# Patient Record
Sex: Male | Born: 1965 | Race: White | Hispanic: No | Marital: Married | Smoking: Never smoker
Health system: Southern US, Community
[De-identification: ages and names within clinical notes are randomized; demographics above are authoritative.]

## PROBLEM LIST (undated history)

## (undated) DIAGNOSIS — E109 Type 1 diabetes mellitus without complications: Secondary | ICD-10-CM

## (undated) HISTORY — PX: NASAL SINUS SURGERY: SHX719

---

## 2007-01-28 ENCOUNTER — Ambulatory Visit: Payer: Self-pay | Admitting: Unknown Physician Specialty

## 2007-02-20 ENCOUNTER — Ambulatory Visit: Payer: Self-pay | Admitting: Unknown Physician Specialty

## 2019-11-30 ENCOUNTER — Other Ambulatory Visit: Payer: Self-pay

## 2019-11-30 ENCOUNTER — Other Ambulatory Visit: Payer: Self-pay | Admitting: Internal Medicine

## 2019-11-30 ENCOUNTER — Ambulatory Visit
Admission: RE | Admit: 2019-11-30 | Discharge: 2019-11-30 | Disposition: A | Discharge status: Home / Self Care | Payer: BLUE CROSS/BLUE SHIELD | Source: Ambulatory Visit | Attending: Internal Medicine | Admitting: Internal Medicine

## 2019-11-30 DIAGNOSIS — R079 Chest pain, unspecified: Secondary | ICD-10-CM

## 2019-12-01 ENCOUNTER — Encounter
Admission: EM | Disposition: A | Discharge status: Home / Self Care | Payer: Self-pay | Source: Home / Self Care | Attending: Internal Medicine

## 2019-12-01 ENCOUNTER — Inpatient Hospital Stay: Payer: BLUE CROSS/BLUE SHIELD

## 2019-12-01 ENCOUNTER — Other Ambulatory Visit: Payer: Self-pay

## 2019-12-01 ENCOUNTER — Inpatient Hospital Stay
Admission: EM | Admit: 2019-12-01 | Discharge: 2019-12-02 | DRG: 247 | Disposition: A | Discharge status: Home / Self Care | Payer: BLUE CROSS/BLUE SHIELD | Attending: Internal Medicine | Admitting: Internal Medicine

## 2019-12-01 DIAGNOSIS — E871 Hypo-osmolality and hyponatremia: Secondary | ICD-10-CM | POA: Diagnosis present

## 2019-12-01 DIAGNOSIS — Z20822 Contact with and (suspected) exposure to covid-19: Secondary | ICD-10-CM | POA: Diagnosis present

## 2019-12-01 DIAGNOSIS — E782 Mixed hyperlipidemia: Secondary | ICD-10-CM | POA: Diagnosis present

## 2019-12-01 DIAGNOSIS — I2510 Atherosclerotic heart disease of native coronary artery without angina pectoris: Secondary | ICD-10-CM | POA: Diagnosis present

## 2019-12-01 DIAGNOSIS — I4581 Long QT syndrome: Secondary | ICD-10-CM | POA: Diagnosis present

## 2019-12-01 DIAGNOSIS — I10 Essential (primary) hypertension: Secondary | ICD-10-CM | POA: Diagnosis present

## 2019-12-01 DIAGNOSIS — I214 Non-ST elevation (NSTEMI) myocardial infarction: Principal | ICD-10-CM | POA: Diagnosis present

## 2019-12-01 DIAGNOSIS — E109 Type 1 diabetes mellitus without complications: Secondary | ICD-10-CM

## 2019-12-01 DIAGNOSIS — I4510 Unspecified right bundle-branch block: Secondary | ICD-10-CM | POA: Diagnosis present

## 2019-12-01 DIAGNOSIS — R0789 Other chest pain: Secondary | ICD-10-CM

## 2019-12-01 DIAGNOSIS — E876 Hypokalemia: Secondary | ICD-10-CM | POA: Diagnosis present

## 2019-12-01 DIAGNOSIS — Z794 Long term (current) use of insulin: Secondary | ICD-10-CM | POA: Diagnosis not present

## 2019-12-01 DIAGNOSIS — Z880 Allergy status to penicillin: Secondary | ICD-10-CM | POA: Diagnosis not present

## 2019-12-01 DIAGNOSIS — Z9641 Presence of insulin pump (external) (internal): Secondary | ICD-10-CM | POA: Diagnosis present

## 2019-12-01 HISTORY — DX: Type 1 diabetes mellitus without complications: E10.9

## 2019-12-01 HISTORY — PX: CORONARY STENT INTERVENTION: CATH118234

## 2019-12-01 HISTORY — PX: LEFT HEART CATH AND CORONARY ANGIOGRAPHY: CATH118249

## 2019-12-01 LAB — BASIC METABOLIC PANEL
Anion gap: 9 (ref 5–15)
BUN: 10 mg/dL (ref 6–20)
CO2: 24 mmol/L (ref 22–32)
Calcium: 8.5 mg/dL — ABNORMAL LOW (ref 8.9–10.3)
Chloride: 101 mmol/L (ref 98–111)
Creatinine, Ser: 0.91 mg/dL (ref 0.61–1.24)
GFR calc Af Amer: >60 mL/min (ref >60)
GFR calc non Af Amer: >60 mL/min (ref >60)
Glucose, Bld: 234 mg/dL — ABNORMAL HIGH (ref 70–99)
Potassium: 4.3 mmol/L (ref 3.5–5.1)
Sodium: 134 mmol/L — ABNORMAL LOW (ref 135–145)

## 2019-12-01 LAB — GLUCOSE, CAPILLARY
Glucose-Capillary: 112 mg/dL — ABNORMAL HIGH (ref 70–99)
Glucose-Capillary: 172 mg/dL — ABNORMAL HIGH (ref 70–99)
Glucose-Capillary: 176 mg/dL — ABNORMAL HIGH (ref 70–99)
Glucose-Capillary: 188 mg/dL — ABNORMAL HIGH (ref 70–99)
Glucose-Capillary: 221 mg/dL — ABNORMAL HIGH (ref 70–99)
Glucose-Capillary: 230 mg/dL — ABNORMAL HIGH (ref 70–99)
Glucose-Capillary: 234 mg/dL — ABNORMAL HIGH (ref 70–99)
Glucose-Capillary: 241 mg/dL — ABNORMAL HIGH (ref 70–99)

## 2019-12-01 LAB — TROPONIN I (HIGH SENSITIVITY)
Troponin I (High Sensitivity): 15 ng/L (ref <18)
Troponin I (High Sensitivity): 269 ng/L (ref <18)

## 2019-12-01 LAB — CBC
HCT: 44.3 % (ref 39.0–52.0)
HCT: 46.2 % (ref 39.0–52.0)
Hemoglobin: 15.1 g/dL (ref 13.0–17.0)
Hemoglobin: 15.6 g/dL (ref 13.0–17.0)
MCH: 30.6 pg (ref 26.0–34.0)
MCH: 30.3 pg (ref 26.0–34.0)
MCHC: 34.1 g/dL (ref 30.0–36.0)
MCHC: 33.8 g/dL (ref 30.0–36.0)
MCV: 89.7 fL (ref 80.0–100.0)
MCV: 89.7 fL (ref 80.0–100.0)
Platelets: 321 10*3/uL (ref 150–400)
Platelets: 339 10*3/uL (ref 150–400)
RBC: 4.94 MIL/uL (ref 4.22–5.81)
RBC: 5.15 MIL/uL (ref 4.22–5.81)
RDW: 11.9 % (ref 11.5–15.5)
RDW: 12 % (ref 11.5–15.5)
WBC: 8.4 10*3/uL (ref 4.0–10.5)
WBC: 10.1 10*3/uL (ref 4.0–10.5)
nRBC: 0 % (ref 0.0–0.2)
nRBC: 0 % (ref 0.0–0.2)

## 2019-12-01 LAB — CBC WITH DIFFERENTIAL/PLATELET
Abs Immature Granulocytes: 0.02 10*3/uL (ref 0.00–0.07)
Basophils Absolute: 0.1 10*3/uL (ref 0.0–0.1)
Basophils Relative: 1 %
Eosinophils Absolute: 0.1 10*3/uL (ref 0.0–0.5)
Eosinophils Relative: 1 %
HCT: 43.6 % (ref 39.0–52.0)
Hemoglobin: 14.8 g/dL (ref 13.0–17.0)
Immature Granulocytes: 0 %
Lymphocytes Relative: 32 %
Lymphs Abs: 2 10*3/uL (ref 0.7–4.0)
MCH: 30.5 pg (ref 26.0–34.0)
MCHC: 33.9 g/dL (ref 30.0–36.0)
MCV: 89.9 fL (ref 80.0–100.0)
Monocytes Absolute: 0.7 10*3/uL (ref 0.1–1.0)
Monocytes Relative: 11 %
Neutro Abs: 3.4 10*3/uL (ref 1.7–7.7)
Neutrophils Relative %: 55 %
Platelets: 310 10*3/uL (ref 150–400)
RBC: 4.85 MIL/uL (ref 4.22–5.81)
RDW: 12.1 % (ref 11.5–15.5)
WBC: 6.2 10*3/uL (ref 4.0–10.5)
nRBC: 0 % (ref 0.0–0.2)

## 2019-12-01 LAB — HEPARIN LEVEL (UNFRACTIONATED): Heparin Unfractionated: 0.62 IU/mL (ref 0.30–0.70)

## 2019-12-01 LAB — MAGNESIUM: Magnesium: 2.1 mg/dL (ref 1.7–2.4)

## 2019-12-01 LAB — COMPREHENSIVE METABOLIC PANEL
ALT: 24 U/L (ref 0–44)
AST: 17 U/L (ref 15–41)
Albumin: 4.2 g/dL (ref 3.5–5.0)
Alkaline Phosphatase: 70 U/L (ref 38–126)
Anion gap: 13 (ref 5–15)
BUN: 14 mg/dL (ref 6–20)
CO2: 23 mmol/L (ref 22–32)
Calcium: 9 mg/dL (ref 8.9–10.3)
Chloride: 100 mmol/L (ref 98–111)
Creatinine, Ser: 1 mg/dL (ref 0.61–1.24)
GFR calc Af Amer: >60 mL/min (ref >60)
GFR calc non Af Amer: >60 mL/min (ref >60)
Glucose, Bld: 262 mg/dL — ABNORMAL HIGH (ref 70–99)
Potassium: 3.2 mmol/L — ABNORMAL LOW (ref 3.5–5.1)
Sodium: 136 mmol/L (ref 135–145)
Total Bilirubin: 1 mg/dL (ref 0.3–1.2)
Total Protein: 6.9 g/dL (ref 6.5–8.1)

## 2019-12-01 LAB — LIPASE, BLOOD: Lipase: 30 U/L (ref 11–51)

## 2019-12-01 LAB — HEMOGLOBIN A1C
Hgb A1c MFr Bld: 6.7 % — ABNORMAL HIGH (ref 4.8–5.6)
Mean Plasma Glucose: 145.59 mg/dL

## 2019-12-01 LAB — LIPID PANEL
Cholesterol: 193 mg/dL (ref 0–200)
HDL: 49 mg/dL (ref >40)
LDL Cholesterol: 140 mg/dL — ABNORMAL HIGH (ref 0–99)
Total CHOL/HDL Ratio: 3.9 RATIO
Triglycerides: 22 mg/dL (ref <150)
VLDL: 4 mg/dL (ref 0–40)

## 2019-12-01 LAB — SARS CORONAVIRUS 2 (TAT 6-24 HRS): SARS Coronavirus 2: NEGATIVE

## 2019-12-01 LAB — HIV ANTIBODY (ROUTINE TESTING W REFLEX): HIV Screen 4th Generation wRfx: NONREACTIVE

## 2019-12-01 LAB — PROTIME-INR
INR: 0.9 (ref 0.8–1.2)
Prothrombin Time: 12.3 seconds (ref 11.4–15.2)

## 2019-12-01 LAB — APTT: aPTT: 26 seconds (ref 24–36)

## 2019-12-01 SURGERY — LEFT HEART CATH AND CORONARY ANGIOGRAPHY
Anesthesia: Moderate Sedation

## 2019-12-01 MED ORDER — SODIUM CHLORIDE 0.9% FLUSH: 3.0000 mL | INTRAVENOUS | Status: DC | PRN

## 2019-12-01 MED ORDER — ASPIRIN 81 MG PO CHEW: 81.0000 mg | CHEWABLE_TABLET | ORAL | Status: DC

## 2019-12-01 MED ORDER — NITROGLYCERIN 0.4 MG SL SUBL: 0.4000 mg | SUBLINGUAL_TABLET | SUBLINGUAL | Status: DC | PRN

## 2019-12-01 MED ORDER — BIVALIRUDIN TRIFLUOROACETATE 250 MG IV SOLR
INTRAVENOUS | Status: AC
  Filled 2019-12-01: qty 250

## 2019-12-01 MED ORDER — PRASUGREL HCL 10 MG PO TABS
ORAL_TABLET | ORAL | Status: DC | PRN
  Administered 2019-12-01: 60 mg via ORAL

## 2019-12-01 MED ORDER — PRASUGREL HCL 10 MG PO TABS
10.0000 mg | ORAL_TABLET | Freq: Every day | ORAL | Status: DC
  Administered 2019-12-02: 10 mg via ORAL
  Filled 2019-12-01: qty 1

## 2019-12-01 MED ORDER — METOPROLOL TARTRATE 25 MG PO TABS
12.5000 mg | ORAL_TABLET | Freq: Two times a day (BID) | ORAL | Status: DC
  Administered 2019-12-01 – 2019-12-02 (×3): 12.5 mg via ORAL
  Filled 2019-12-01 (×3): qty 1

## 2019-12-01 MED ORDER — MIDAZOLAM HCL 2 MG/2ML IJ SOLN
INTRAMUSCULAR | Status: DC | PRN
  Administered 2019-12-01 (×3): 1 mg via INTRAVENOUS

## 2019-12-01 MED ORDER — IOHEXOL 300 MG/ML  SOLN
INTRAMUSCULAR | Status: DC | PRN
  Administered 2019-12-01: 15:00:00 75 mL via INTRA_ARTERIAL

## 2019-12-01 MED ORDER — POTASSIUM CHLORIDE 20 MEQ PO PACK
40.0000 meq | PACK | Freq: Once | ORAL | Status: AC
  Administered 2019-12-01: 40 meq via ORAL
  Filled 2019-12-01: qty 2

## 2019-12-01 MED ORDER — INSULIN PUMP
SUBCUTANEOUS | Status: DC
  Administered 2019-12-01: 1.3 via SUBCUTANEOUS
  Filled 2019-12-01: qty 1

## 2019-12-01 MED ORDER — POTASSIUM CHLORIDE 20 MEQ PO PACK: 40.0000 meq | PACK | Freq: Once | ORAL | Status: DC

## 2019-12-01 MED ORDER — ASPIRIN 81 MG PO CHEW: 324.0000 mg | CHEWABLE_TABLET | Freq: Once | ORAL | Status: DC

## 2019-12-01 MED ORDER — FENTANYL CITRATE (PF) 100 MCG/2ML IJ SOLN
INTRAMUSCULAR | Status: AC
  Filled 2019-12-01: qty 2

## 2019-12-01 MED ORDER — MIDAZOLAM HCL 2 MG/2ML IJ SOLN
INTRAMUSCULAR | Status: AC
  Filled 2019-12-01: qty 2

## 2019-12-01 MED ORDER — NITROGLYCERIN 1 MG/10 ML FOR IR/CATH LAB
INTRA_ARTERIAL | Status: DC | PRN
  Administered 2019-12-01: 200 ug via INTRACORONARY

## 2019-12-01 MED ORDER — ACETAMINOPHEN 325 MG PO TABS: 650.0000 mg | ORAL_TABLET | ORAL | Status: DC | PRN

## 2019-12-01 MED ORDER — HEPARIN (PORCINE) IN NACL 1000-0.9 UT/500ML-% IV SOLN
INTRAVENOUS | Status: DC | PRN
  Administered 2019-12-01: 500 mL

## 2019-12-01 MED ORDER — METOPROLOL TARTRATE 25 MG PO TABS: 12.5000 mg | ORAL_TABLET | Freq: Two times a day (BID) | ORAL | Status: DC

## 2019-12-01 MED ORDER — SODIUM CHLORIDE 0.9 % IV SOLN
INTRAVENOUS | Status: AC | PRN
  Administered 2019-12-01: 500 mL via INTRAVENOUS

## 2019-12-01 MED ORDER — ASPIRIN EC 81 MG PO TBEC
81.0000 mg | DELAYED_RELEASE_TABLET | Freq: Every day | ORAL | Status: DC
  Administered 2019-12-02: 81 mg via ORAL
  Filled 2019-12-01: qty 1

## 2019-12-01 MED ORDER — FENTANYL CITRATE (PF) 100 MCG/2ML IJ SOLN
INTRAMUSCULAR | Status: DC | PRN
  Administered 2019-12-01: 50 ug via INTRAVENOUS
  Administered 2019-12-01: 25 ug via INTRAVENOUS

## 2019-12-01 MED ORDER — ATORVASTATIN CALCIUM 80 MG PO TABS
80.0000 mg | ORAL_TABLET | Freq: Every day | ORAL | Status: DC
  Administered 2019-12-01 (×2): 80 mg via ORAL
  Filled 2019-12-01: qty 4
  Filled 2019-12-01: qty 1

## 2019-12-01 MED ORDER — MIDAZOLAM HCL 2 MG/2ML IJ SOLN
INTRAMUSCULAR | Status: DC | PRN
  Administered 2019-12-01: 1 mg via INTRAVENOUS

## 2019-12-01 MED ORDER — ASPIRIN 300 MG RE SUPP: 300.0000 mg | RECTAL | Status: DC

## 2019-12-01 MED ORDER — IOHEXOL 350 MG/ML SOLN
75.0000 mL | Freq: Once | INTRAVENOUS | Status: AC | PRN
  Administered 2019-12-01: 75 mL via INTRAVENOUS

## 2019-12-01 MED ORDER — VITAMIN D 25 MCG (1000 UNIT) PO TABS
1000.0000 [IU] | ORAL_TABLET | Freq: Every day | ORAL | Status: DC
  Administered 2019-12-02: 1000 [IU] via ORAL
  Filled 2019-12-01: qty 1

## 2019-12-01 MED ORDER — TICAGRELOR 90 MG PO TABS
ORAL_TABLET | ORAL | Status: AC
  Filled 2019-12-01: qty 2

## 2019-12-01 MED ORDER — INSULIN ASPART 100 UNIT/ML ~~LOC~~ SOLN: 0.0000 [IU] | Freq: Four times a day (QID) | SUBCUTANEOUS | Status: DC

## 2019-12-01 MED ORDER — HEPARIN (PORCINE) IN NACL 1000-0.9 UT/500ML-% IV SOLN
INTRAVENOUS | Status: AC
  Filled 2019-12-01: qty 1000

## 2019-12-01 MED ORDER — SODIUM CHLORIDE 0.9 % WEIGHT BASED INFUSION: 1.0000 mL/kg/h | INTRAVENOUS | Status: DC

## 2019-12-01 MED ORDER — FENTANYL CITRATE (PF) 100 MCG/2ML IJ SOLN
INTRAMUSCULAR | Status: DC | PRN
  Administered 2019-12-01: 25 ug via INTRAVENOUS

## 2019-12-01 MED ORDER — ASPIRIN 81 MG PO CHEW: 324.0000 mg | CHEWABLE_TABLET | ORAL | Status: AC

## 2019-12-01 MED ORDER — ONDANSETRON HCL 4 MG/2ML IJ SOLN: 4.0000 mg | Freq: Four times a day (QID) | INTRAMUSCULAR | Status: DC | PRN

## 2019-12-01 MED ORDER — SODIUM CHLORIDE 0.9 % IV SOLN: 250.0000 mL | INTRAVENOUS | Status: DC | PRN

## 2019-12-01 MED ORDER — SODIUM CHLORIDE 0.9% FLUSH
3.0000 mL | Freq: Two times a day (BID) | INTRAVENOUS | Status: DC
  Administered 2019-12-02: 3 mL via INTRAVENOUS

## 2019-12-01 MED ORDER — ADULT MULTIVITAMIN W/MINERALS CH
1.0000 | ORAL_TABLET | Freq: Every day | ORAL | Status: DC
  Filled 2019-12-01: qty 1

## 2019-12-01 MED ORDER — SODIUM CHLORIDE 0.9 % WEIGHT BASED INFUSION
1.0000 mL/kg/h | INTRAVENOUS | Status: AC
  Administered 2019-12-01: 1 mL/kg/h via INTRAVENOUS

## 2019-12-01 MED ORDER — SODIUM CHLORIDE 0.9 % IV SOLN
INTRAVENOUS | Status: DC | PRN
  Administered 2019-12-01: 14:00:00 1.75 mg/kg/h via INTRAVENOUS

## 2019-12-01 MED ORDER — IOHEXOL 300 MG/ML  SOLN
INTRAMUSCULAR | Status: DC | PRN
  Administered 2019-12-01: 85 mL

## 2019-12-01 MED ORDER — SODIUM CHLORIDE 0.9% FLUSH
3.0000 mL | Freq: Two times a day (BID) | INTRAVENOUS | Status: DC
  Administered 2019-12-01 – 2019-12-02 (×3): 3 mL via INTRAVENOUS

## 2019-12-01 MED ORDER — INSULIN ASPART 100 UNIT/ML ~~LOC~~ SOLN: 75.0000 [IU] | Freq: Every day | SUBCUTANEOUS | Status: DC

## 2019-12-01 MED ORDER — SODIUM CHLORIDE 0.9 % WEIGHT BASED INFUSION: 3.0000 mL/kg/h | INTRAVENOUS | Status: DC

## 2019-12-01 MED ORDER — NITROGLYCERIN 1 MG/10 ML FOR IR/CATH LAB
INTRA_ARTERIAL | Status: AC
  Filled 2019-12-01: qty 10

## 2019-12-01 MED ORDER — HEPARIN BOLUS VIA INFUSION
4000.0000 [IU] | Freq: Once | INTRAVENOUS | Status: AC
  Administered 2019-12-01: 4000 [IU] via INTRAVENOUS
  Filled 2019-12-01: qty 4000

## 2019-12-01 MED ORDER — NITROGLYCERIN 2 % TD OINT
0.5000 [in_us] | TOPICAL_OINTMENT | Freq: Four times a day (QID) | TRANSDERMAL | Status: DC
  Administered 2019-12-01 – 2019-12-02 (×3): 0.5 [in_us] via TOPICAL
  Filled 2019-12-01 (×4): qty 1

## 2019-12-01 MED ORDER — PRASUGREL HCL 10 MG PO TABS
ORAL_TABLET | ORAL | Status: AC
  Filled 2019-12-01: qty 6

## 2019-12-01 MED ORDER — BIVALIRUDIN BOLUS VIA INFUSION - CUPID
INTRAVENOUS | Status: DC | PRN
  Administered 2019-12-01: 54.6 mg via INTRAVENOUS

## 2019-12-01 MED ORDER — ZOLPIDEM TARTRATE 5 MG PO TABS: 5.0000 mg | ORAL_TABLET | Freq: Every evening | ORAL | Status: DC | PRN

## 2019-12-01 MED ORDER — HEPARIN (PORCINE) 25000 UT/250ML-% IV SOLN
1000.0000 [IU]/h | INTRAVENOUS | Status: DC
  Administered 2019-12-01: 1000 [IU]/h via INTRAVENOUS
  Filled 2019-12-01: qty 250

## 2019-12-01 SURGICAL SUPPLY — 20 items
BALLN EUPHORA RX 2.5X12 (BALLOONS) ×3
BALLN ~~LOC~~ TREK RX 3.75X12 (BALLOONS) ×3
BALLOON EUPHORA RX 2.5X12 (BALLOONS) ×1 IMPLANT
BALLOON ~~LOC~~ TREK RX 3.75X12 (BALLOONS) ×1 IMPLANT
CATH EAGLE EYE PLAT IMAGING (CATHETERS) ×3 IMPLANT
CATH INFINITI 5FR ANG PIGTAIL (CATHETERS) ×3 IMPLANT
CATH INFINITI 5FR JL4 (CATHETERS) ×3 IMPLANT
CATH INFINITI JR4 5F (CATHETERS) ×3 IMPLANT
CATH VISTA GUIDE 6FR XBLAD3.5 (CATHETERS) ×3 IMPLANT
CATH VISTA GUIDE 6FR XBLAD4 (CATHETERS) ×3 IMPLANT
DEVICE CLOSURE MYNXGRIP 6/7F (Vascular Products) ×3 IMPLANT
DEVICE INFLAT 30 PLUS (MISCELLANEOUS) ×3 IMPLANT
KIT MANI 3VAL PERCEP (MISCELLANEOUS) ×3 IMPLANT
NEEDLE PERC 18GX7CM (NEEDLE) ×3 IMPLANT
PACK CARDIAC CATH (CUSTOM PROCEDURE TRAY) ×3 IMPLANT
SHEATH AVANTI 5FR X 11CM (SHEATH) ×3 IMPLANT
SHEATH AVANTI 6FR X 11CM (SHEATH) ×3 IMPLANT
STENT RESOLUTE ONYX 3.5X34 (Permanent Stent) ×3 IMPLANT
WIRE GUIDERIGHT .035X150 (WIRE) ×3 IMPLANT
WIRE RUNTHROUGH .014X180CM (WIRE) ×3 IMPLANT

## 2019-12-01 NOTE — ED Notes (Signed)
Pt ambulates on own to restroom in room.

## 2019-12-01 NOTE — ED Triage Notes (Addendum)
Pt arrives to ED via EMS due to chest pain.  States chest pain has been ongoing for 3 weeks and has seen by PCP and cardiology. States tonight at approx 0030 he felt a pain in the center of the chest that radiated to back. PT reports lack of sleep the last day, possible anxiety at this  Time.  PT self administered 324mg  aspirin at home prior to EMS arrival

## 2019-12-01 NOTE — ED Notes (Signed)
Pt requesting that his blood sugar be checked, states that he had to remove his insulin pump and reader for his ct scan. Pt asks that we check his glucose every hour until he is able to replace his pump and monitor. Pt states that his wife will bring another needle, but wasn't planning to have her do that until he is admitted into a pt room Pt assisted to the bathroom without difficulty.

## 2019-12-01 NOTE — ED Notes (Signed)
Pt allowed to ambulate to toilet for voiding, pt returned to bed and provided water and applesauce. Pt states he is going to dose self with insulin at this time. States BG is 279.

## 2019-12-01 NOTE — ED Notes (Signed)
Hospitalist at bedside 

## 2019-12-01 NOTE — ED Notes (Signed)
Admitting dr to the desk to update this RN regarding pt's plan of care. Maintain pt at npo, pt given warm blanket upon request, lights turned down, pt states current blood sugar at 188. No distress or further needs at this time, oriented to the call bell as needed

## 2019-12-01 NOTE — ED Notes (Signed)
PT states at this time he is unsure if he will allow COVId swab or refuse. Patient is experiencing increased anxiety following education on condition by MD and nurse. PT provided time by self to collect thoughts and nurse will go back shortly.

## 2019-12-01 NOTE — Plan of Care (Signed)
Pt arrived from Cath lab. Right Fem site is WNL. No skin issues and verified with Laney Pastor. Pt oriented room and unit.

## 2019-12-01 NOTE — Progress Notes (Signed)
ANTICOAGULATION CONSULT NOTE - Initial Consult  Pharmacy Consult for heparin Indication: chest pain/ACS  Allergies  Allergen Reactions  . Penicillins     Patient Measurements: Height: 5\' 8"  (172.7 cm) Weight: 160 lb 6.4 oz (72.8 kg) IBW/kg (Calculated) : 68.4 Heparin Dosing Weight: 72.8 kg  Vital Signs: Temp: 97.8 F (36.6 C) (03/12 0134) Temp Source: Oral (03/12 0134) BP: 165/76 (03/12 0330) Pulse Rate: 70 (03/12 0330)  Labs: Recent Labs    12/01/19 0139 12/01/19 0333  HGB 14.8  --   HCT 43.6  --   PLT 310  --   CREATININE 1.00  --   TROPONINIHS 15 269*    Estimated Creatinine Clearance: 84.6 mL/min (by C-G formula based on SCr of 1 mg/dL).   Medical History: Past Medical History:  Diagnosis Date  . Type 1 diabetes (HCC)     Medications:  Scheduled:  . heparin  4,000 Units Intravenous Once    Assessment: Patient admitted for CP x 3 weeks that starts at center of chest and radiates to back w/ h/o atypical CP, DMI, and HLD doesn't appear to be on anticoagulation PTA. Initial trop was 15 then >> 269, EKG pending. Baseline aPTT/INR pending. Patient is being started heparin drip for management of NSTEMI.  Goal of Therapy:  Heparin level 0.3-0.7 units/ml Monitor platelets by anticoagulation protocol: Yes   Plan:  Will bolus heparin 4000 units IV x 1 Will start heparin rate at 1000 units/hr Will check anti-Xa at 1000. Will monitor daily CBC's and adjust per anti-Xa levels.  01/31/20, PharmD, BCPS Clinical Pharmacist 12/01/2019,4:13 AM

## 2019-12-01 NOTE — ED Notes (Signed)
Received call from cath lab RN that they had called lab at [facility] where 6-24hr swab was being run and were told it would be 1500 before result would be available. CL RN states they will just come and get pt now to prep for cath lab per cardiology.
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
## 2019-12-01 NOTE — ED Notes (Signed)
Troponin 269, Reported from lab. DR. Scotty Court notified.

## 2019-12-01 NOTE — ED Notes (Addendum)
Notified by MD Colon Branch that lab had been called and asked if it would be better to redo pt's Covid swab as a rapid test d/t need for cath lab today or wait on the previously collected sample to result. Per MD, lab stated that previously collected sample would result faster than a new rapid test would and it would be a waste of resources to run another one. 2nd swab held at this time per MD.

## 2019-12-01 NOTE — Progress Notes (Signed)
Inpatient Diabetes Program Recommendations  AACE/ADA: New Consensus Statement on Inpatient Glycemic Control (2015)  Target Ranges:  Prepandial:   less than 140 mg/dL      Peak postprandial:   less than 180 mg/dL (1-2 hours)      Critically ill patients:  140 - 180 mg/dL   Lab Results  Component Value Date   GLUCAP 234 (H) 12/01/2019   HGBA1C 6.7 (H) 12/01/2019    Review of Glycemic Control  Diabetes history: DM1 Outpatient Diabetes medications: T Slim insulin pump Current orders for Inpatient glycemic control: Insulin pump + Novolog 0-6 correction scale q 4 hrs.  Inpatient Diabetes Program Recommendations:    If patient is covering CBGs with insulin pump, will need to D/C Novolog correction scale. Patient will need to sign insulin pump contract, and keep flowsheet in his room to document amount of insulin given. CBGs will continue to be by hospital meter.  Per office visit note with endocrinologist Dr. Gershon Crane 10/16/19: "He is currently on the T slim pump (not basal IQ) as above: Basal rates Midnight = 0.575 4 AM = 0.675  7 AM = 0.7 11 AM = 0.6 1 PM = 0.7 6 PM = 0.9 TDD basal: 17.225 units  Bolus settings I/C: 8 at midnight, 6 at 6 PM ISF: 40 Target Glucose: 100 Active insulin time: 4 hours  Total Daily dose approximately 40 units."  Thank you, Darel Hong E. Johncarlos Holtsclaw, RN, MSN, CDE  Diabetes Coordinator Inpatient Glycemic Control Team Team Pager [phone] (8am-5pm) 12/01/2019 11:11 AM

## 2019-12-01 NOTE — ED Notes (Signed)
Pt states he took 324mg  baby aspirin while at home prior to EMS arrival. Md notified. Order DC

## 2019-12-01 NOTE — ED Notes (Signed)
Pt resting quietly at this time

## 2019-12-01 NOTE — ED Notes (Signed)
Pt being taken to ct via ct tech

## 2019-12-01 NOTE — Consult Note (Signed)
[facility] Cardiology Consultation Note  Patient ID: Jamie Roach, MRN: [id number], DOB/AGE: January 01, 1966 54 y.o. Admit date: 12/01/2019   Date of Consult: 12/01/2019 Primary Physician: Albina Billet, MD Primary Cardiologist: Ubaldo Glassing  Chief Complaint:  Chief Complaint  Patient presents with  . Chest Pain   Reason for Consult: Chest pain  HPI: 54 y.o. male with diabetes hyperlipidemia who has had significant episodes of chest discomfort over the last several weeks waxing and waning.  This is culminated to wrist pain for which the patient has had concerns.  EKG has shown normal sinus rhythm.  Normal EKG.  The patient additionally has had an elevated troponin of 269 consistent with possible non-ST elevation myocardial infarction.  The patient was placed on heparin and has had some other mild symptoms of chest pain throughout the back.  Currently he is having some discomfort to a minor degree.  Past Medical History:  Diagnosis Date  . Type 1 diabetes [facility])       Surgical History:  Past Surgical History:  Procedure Laterality Date  . NASAL SINUS SURGERY       Home Meds: Prior to Admission medications   Medication Sig Start Date End Date Taking? Authorizing Provider  Cholecalciferol 25 MCG (1000 UT) capsule Take by mouth.   Yes [provider]  Multiple Vitamin (MULTI-VITAMIN) tablet Take by mouth.   Yes [provider]  NOVOLOG 100 UNIT/ML injection SMARTSIG:75 Unit(s) SUB-Q Daily 09/18/19  Yes [provider]    Inpatient Medications:  . aspirin  324 mg Oral NOW   Or  . aspirin  300 mg Rectal NOW  . [START ON 12/02/2019] aspirin EC  81 mg Oral Daily  . atorvastatin  80 mg Oral q1800  . cholecalciferol  1,000 Units Oral Daily  . insulin aspart  0-9 Units Subcutaneous Q6H  . insulin pump   Subcutaneous Q4H  . metoprolol tartrate  12.5 mg Oral BID  . multivitamin with minerals  1 tablet Oral Daily  . nitroGLYCERIN  0.5 inch Topical Q6H  . potassium  chloride  40 mEq Oral Once   . heparin 1,000 Units/hr (12/01/19 0445)    Allergies:  Allergies  Allergen Reactions  . Penicillins     Social History   Socioeconomic History  . Marital status: Married    Spouse name: Not on file  . Number of children: Not on file  . Years of education: Not on file  . Highest education level: Not on file  Occupational History  . Not on file  Tobacco Use  . Smoking status: Never Smoker  . Smokeless tobacco: Never Used  Substance and Sexual Activity  . Alcohol use: Never  . Drug use: Never  . Sexual activity: Yes  Other Topics Concern  . Not on file  Social History Narrative  . Not on file   Social Determinants of Health   Financial Resource Strain:   . Difficulty of Paying Living Expenses:   Food Insecurity:   . Worried About Charity fundraiser in the Last Year:   . Arboriculturist in the Last Year:   Transportation Needs:   . Film/video editor (Medical):   Marland Kitchen Lack of Transportation (Non-Medical):   Physical Activity:   . Days of Exercise per Week:   . Minutes of Exercise per Session:   Stress:   . Feeling of Stress :   Social Connections:   . Frequency of Communication with Friends and Family:   .  Frequency of Social Gatherings with Friends and Family:   . Attends Religious Services:   . Active Member of Clubs or Organizations:   . Attends Banker Meetings:   Marland Kitchen Marital Status:   Intimate Partner Violence:   . Fear of Current or Ex-Partner:   . Emotionally Abused:   Marland Kitchen Physically Abused:   . Sexually Abused:      History reviewed. No pertinent family history.   Review of Systems Positive for chest pain Negative for: General:  chills, fever, night sweats or weight changes.  Cardiovascular: PND orthopnea syncope dizziness  Dermatological skin lesions rashes Respiratory: Cough congestion Urologic: Frequent urination urination at night and hematuria Abdominal: negative for nausea, vomiting, diarrhea,  bright red blood per rectum, melena, or hematemesis Neurologic: negative for visual changes, and/or hearing changes  All other systems reviewed and are otherwise negative except as noted above.  Labs: No results for input(s): CKTOTAL, CKMB, TROPONINI in the last 72 hours. Lab Results  Component Value Date   WBC 8.4 12/01/2019   HGB 15.1 12/01/2019   HCT 44.3 12/01/2019   MCV 89.7 12/01/2019   PLT 321 12/01/2019    Recent Labs  Lab 12/01/19 0139  NA 136  K 3.2*  CL 100  CO2 23  BUN 14  CREATININE 1.00  CALCIUM 9.0  PROT 6.9  BILITOT 1.0  ALKPHOS 70  ALT 24  AST 17  GLUCOSE 262*   Lab Results  Component Value Date   CHOL 193 12/01/2019   HDL 49 12/01/2019   LDLCALC 140 (H) 12/01/2019   TRIG 22 12/01/2019   No results found for: DDIMER  Radiology/Studies:  DG Chest 2 View  Result Date: 11/30/2019 CLINICAL DATA:  Chest pain EXAM: CHEST - 2 VIEW COMPARISON:  None. FINDINGS: The heart size and mediastinal contours are within normal limits. Both lungs are clear. The visualized skeletal structures are unremarkable. IMPRESSION: No acute abnormality of the lungs. Electronically Signed   By: Lauralyn Primes M.D.   On: 11/30/2019 16:11    EKG: Normal sinus rhythm otherwise normal EKG  Weights: Filed Weights   12/01/19 0136  Weight: 72.8 kg  Rest of physical exam as per prime doc    Assessment: 54 year old male with hyperlipidemia diabetes with a possible acute non-ST elevation myocardial infarction  Plan: 1.  Heparin for further risk reduction of myocardial infarction in addition would use aspirin 2.  Beta-blocker if able for better heart rate control and risk reduction of anginal symptoms 3.  Nitrates for chest pain 4.  Proceed to cardiac catheterization to assess coronary anatomy and further treatment thereof as necessary.  Patient understands risk and benefits of cardiac catheterization.  This includes a possibility of death stroke heart attack infection bleeding or  blood clot.  He is at low risk for conscious sedation  Signed, Lamar Blinks M.D. [facility] Cardiology 12/01/2019, 8:16 AM

## 2019-12-01 NOTE — Progress Notes (Signed)
ANTICOAGULATION CONSULT NOTE - Initial Consult  Pharmacy Consult for heparin Indication: chest pain/ACS  Allergies  Allergen Reactions  . Penicillins     Patient Measurements: Height: 5\' 8"  (172.7 cm) Weight: 160 lb 6.4 oz (72.8 kg) IBW/kg (Calculated) : 68.4 Heparin Dosing Weight: 72.8 kg  Vital Signs: Temp: 97.8 F (36.6 C) (03/12 0134) Temp Source: Oral (03/12 0134) BP: 138/68 (03/12 1230) Pulse Rate: 108 (03/12 1230)  Labs: Recent Labs    12/01/19 0139 12/01/19 0139 12/01/19 0333 12/01/19 0442 12/01/19 1018  HGB 14.8   < >  --  15.1 15.6  HCT 43.6  --   --  44.3 46.2  PLT 310  --   --  321 339  APTT 26  --   --   --   --   LABPROT 12.3  --   --   --   --   INR 0.9  --   --   --   --   HEPARINUNFRC  --   --   --   --  0.62  CREATININE 1.00  --   --   --  0.91  TROPONINIHS 15  --  269*  --   --    < > = values in this interval not displayed.    Estimated Creatinine Clearance: 92.9 mL/min (by C-G formula based on SCr of 0.91 mg/dL).   Medical History: Past Medical History:  Diagnosis Date  . Type 1 diabetes (HCC)     Medications:  Scheduled:  . aspirin  324 mg Oral NOW   Or  . aspirin  300 mg Rectal NOW  . [START ON 12/02/2019] aspirin  81 mg Oral Pre-Cath  . [START ON 12/02/2019] aspirin EC  81 mg Oral Daily  . atorvastatin  80 mg Oral q1800  . cholecalciferol  1,000 Units Oral Daily  . insulin aspart  0-9 Units Subcutaneous Q6H  . insulin pump   Subcutaneous Q4H  . metoprolol tartrate  12.5 mg Oral BID  . multivitamin with minerals  1 tablet Oral Daily  . nitroGLYCERIN  0.5 inch Topical Q6H  . potassium chloride  40 mEq Oral Once  . sodium chloride flush  3 mL Intravenous Q12H    Assessment: Patient admitted for CP x 3 weeks that starts at center of chest and radiates to back w/ h/o atypical CP, DMI, and HLD doesn't appear to be on anticoagulation PTA. Initial trop was 15 then >> 269, EKG pending. Baseline aPTT/INR pending. Patient is being  started heparin drip for management of NSTEMI.  3/12 Heparin drip initiated at 1000 units/hr 3/12 10:18 HL 0.62, therapeutic x 1  Goal of Therapy:  Heparin level 0.3-0.7 units/ml Monitor platelets by anticoagulation protocol: Yes   Plan:  3/12 10:18 HL 0.62 is therapeutic. Will continue with current rate of 1000 units/hr. Recheck HL in 6 hours for confirmation. Daily CBC while on Heparin drip.  5/12, PharmD, BCPS 12/01/2019 12:50 PM

## 2019-12-01 NOTE — Progress Notes (Signed)
[facility] Cardiology [facility] Encounter Note  Patient: Jamie Roach / Admit Date: 12/01/2019 / Date of Encounter: 12/01/2019, 1:28 PM   Subjective: Patient had full resolution of chest discomfort and no new EKG changes.  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 rate 18, height 5\' 8"  (1.727 m), weight 72.8 kg, SpO2 98 %. Body mass index is 24.39 kg/m. General: Well developed, well nourished, in no acute distress. Head: Normocephalic, atraumatic, sclera non-icteric, no xanthomas, nares are without discharge. Neck: No apparent masses Lungs: Normal respirations with no wheezes, no rhonchi, no rales , no crackles   Heart: Regular rate and rhythm, normal S1 S2, no murmur, no rub, no gallop, PMI is normal size and placement, carotid upstroke normal without bruit, jugular venous pressure normal Abdomen: Soft, non-tender, non-distended with normoactive bowel sounds. No hepatosplenomegaly. Abdominal aorta is normal size without bruit Extremities: No edema, no clubbing, no cyanosis, no ulcers,  Peripheral: 2+ radial, 2+ femoral, 2+ dorsal pedal pulses Neuro: Alert and oriented. Moves all extremities spontaneously. Psych:  Responds to questions appropriately with a normal affect.  No intake or output data in the 24 hours ending 12/01/19 1328  Inpatient Medications:  . [MAR Hold]  aspirin  324 mg Oral NOW   Or  . [MAR Hold] aspirin  300 mg Rectal NOW  . [START ON 12/02/2019] aspirin  81 mg Oral Pre-Cath  . [MAR Hold] aspirin EC  81 mg Oral Daily  . [MAR Hold] atorvastatin  80 mg Oral q1800  . [MAR Hold] cholecalciferol  1,000 Units Oral Daily  . [MAR Hold] insulin aspart  0-9 Units Subcutaneous Q6H  . [MAR Hold] insulin pump   Subcutaneous Q4H  . [MAR Hold] metoprolol tartrate  12.5 mg Oral BID  . [MAR Hold] multivitamin with minerals  1 tablet Oral Daily  . [MAR Hold] nitroGLYCERIN  0.5 inch Topical Q6H  . [MAR Hold] potassium chloride  40 mEq Oral Once  . [MAR Hold] sodium chloride flush  3 mL Intravenous Q12H   Infusions:  . sodium chloride    . [START ON 12/02/2019] sodium chloride     Followed by  . [START ON 12/02/2019] sodium chloride    . heparin 1,000 Units/hr (12/01/19 1029)    Labs: Recent Labs    12/01/19 0139 12/01/19 0333 12/01/19 1018  NA 136  --  134*  K 3.2*  --  4.3  CL 100  --  101  CO2 23  --  24  GLUCOSE 262*  --  234*  BUN 14  --  10  CREATININE 1.00  --  0.91  CALCIUM 9.0  --  8.5*  MG  --  2.1  --    Recent Labs    12/01/19 0139  AST 17  ALT 24  ALKPHOS 70  BILITOT 1.0  PROT 6.9  ALBUMIN 4.2   Recent Labs    12/01/19 0139 12/01/19 0139 12/01/19 0442 12/01/19 1018  WBC 6.2   < > 8.4 10.1  NEUTROABS 3.4  --   --   --  HGB 14.8   < > 15.1 15.6  HCT 43.6   < > 44.3 46.2  MCV 89.9   < > 89.7 89.7  PLT 310   < > 321 339   < > = values in this interval not displayed.   No results for input(s): CKTOTAL, CKMB, TROPONINI in the last 72 hours. Invalid input(s): POCBNP Recent Labs    12/01/19 0333  HGBA1C 6.7*     Weights: Filed Weights   12/01/19 0136  Weight: 72.8 kg     Radiology/Studies:  DG Chest 2 View  Result Date: 11/30/2019 CLINICAL DATA:  Chest pain EXAM: CHEST - 2 VIEW COMPARISON:  None. FINDINGS: The heart size and mediastinal contours are within normal limits. Both lungs are clear. The  visualized skeletal structures are unremarkable. IMPRESSION: No acute abnormality of the lungs. Electronically Signed   By: Eddie Candle M.D.   On: 11/30/2019 16:11   CT ANGIO CHEST PE W OR WO CONTRAST  Result Date: 12/01/2019 CLINICAL DATA:  Central chest pain, radiating to back EXAM: CT ANGIOGRAPHY CHEST WITH CONTRAST TECHNIQUE: Multidetector CT imaging of the chest was performed using the standard protocol during bolus administration of intravenous contrast. Multiplanar CT image reconstructions and MIPs were obtained to evaluate the vascular anatomy. CONTRAST:  40mL OMNIPAQUE IOHEXOL 350 MG/ML SOLN COMPARISON:  Chest radiographs, 11/30/2019 FINDINGS: Cardiovascular: Preferential opacification of the thoracic aorta. Normal contour and caliber of the thoracic aorta. No evidence of aneurysm or dissection. No significant aortic atherosclerosis. No evidence of central pulmonary embolism on this non tailored examination normal heart size. Left coronary artery calcifications. No pericardial effusion. Mediastinum/Nodes: No enlarged mediastinal, hilar, or axillary lymph nodes. Thyroid gland, trachea, and esophagus demonstrate no significant findings. Lungs/Pleura: Dependent bibasilar partial atelectasis and/or scarring. No pleural effusion or pneumothorax. Upper Abdomen: No acute abnormality. Musculoskeletal: No chest wall abnormality. No acute or significant osseous findings. Review of the MIP images confirms the above findings. IMPRESSION: 1. No CT evidence of thoracic aortic dissection, aneurysm, or other acute aortic pathology. No significant aortic atherosclerosis. 2. Left coronary artery calcifications. Electronically Signed   By: Eddie Candle M.D.   On: 12/01/2019 08:46     Assessment and Recommendation  54 y.o. 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  PCI and stent  placement of proximal left anterior descending artery 2.  High intensity cholesterol therapy 3.  Dual antiplatelet therapy 4.  Beta-blocker ACE inhibitor if able depending on blood pressure 5.  Begin reading cardiac rehabilitation  Signed, Serafina Royals M.D. FACC

## 2019-12-01 NOTE — Progress Notes (Signed)
Brief hospitalist update note.  Nonbillable note.  Please see scanned H&P for full details.  54 year old male patient presents for evaluation of chest pain.  Troponin elevation consistent with non-ST elevation myocardial infarction.  Case discussed with Dr. Gwen Pounds.  Patient underwent cardiac catheterization today.  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  Patient underwent successful PCI and stent placement of proximal LAD.  Will initiate high intensity statin therapy, dual antiplatelet therapy, beta-blocker therapy, ACE inhibitor therapy if blood pressure tolerates.  Tentative plan to discharge home on 12/02/2019.  Lolita Patella MD

## 2019-12-01 NOTE — ED Provider Notes (Addendum)
[facility] Emergency Department Provider Note  ____________________________________________  Time seen: Approximately 2:20 AM  I have reviewed the triage vital signs and the nursing notes.   HISTORY  Chief Complaint Chest Pain    HPI Jamie Roach is a 54 y.o. male with a history of type 1 diabetes who comes the ED due to central chest pain described as aching, radiating to the back, not associated with shortness of breath diaphoresis or vomiting.  Onset while getting in bed, ready to go to sleep.  No aggravating or alleviating factors.  Lasted about 20 minutes, and when EMS arrived he reported that it is resolved.  Currently chest pain is 1/10.  He has been having the symptoms for the past several weeks, seen by primary care and cardiology Dr. Lady Gary with reassuring work-up so far.   Patient took 324 mg of aspirin at home at the direction of 911 call center prior to EMS arrival at his house.   Past Medical History:  Diagnosis Date  . Type 1 diabetes (HCC)      There are no problems to display for this patient.    Past Surgical History:  Procedure Laterality Date  . NASAL SINUS SURGERY       Prior to Admission medications   Not on File     Allergies Penicillins   History reviewed. No pertinent family history.  Social History Social History   Tobacco Use  . Smoking status: Never Smoker  . Smokeless tobacco: Never Used  Substance Use Topics  . Alcohol use: Never  . Drug use: Never    Review of Systems  Constitutional:   No fever or chills.  ENT:   No sore throat. No rhinorrhea. Cardiovascular: Positive chest pain as above without syncope. Respiratory:   No dyspnea or cough. Gastrointestinal:   Negative for abdominal pain, vomiting and diarrhea.  Musculoskeletal:   Negative for focal pain or swelling All other systems reviewed and are negative except as documented above in ROS and  HPI.  ____________________________________________   PHYSICAL EXAM:  VITAL SIGNS: ED Triage Vitals  Enc Vitals Group     BP 12/01/19 0134 (!) 172/88     Pulse Rate 12/01/19 0134 91     Resp 12/01/19 0134 18     Temp 12/01/19 0134 97.8 F (36.6 C)     Temp Source 12/01/19 0134 Oral     SpO2 12/01/19 0134 100 %     Weight 12/01/19 0136 160 lb 6.4 oz (72.8 kg)     Height 12/01/19 0136 5\' 8"  (1.727 m)     Head Circumference --      Peak Flow --      Pain Score 12/01/19 0135 1     Pain Loc --      Pain Edu? --      Excl. in GC? --     Vital signs reviewed, nursing assessments reviewed.   Constitutional:   Alert and oriented. Non-toxic appearance. Eyes:   Conjunctivae are normal. EOMI. PERRL. ENT      Head:   Normocephalic and atraumatic.      Nose:   Wearing a mask.      Mouth/Throat:   Wearing a mask.      Neck:   No meningismus. Full ROM. Hematological/Lymphatic/Immunilogical:   No cervical lymphadenopathy. Cardiovascular:   RRR. Symmetric bilateral radial and DP pulses.  No murmurs. Cap refill less than 2 seconds. Respiratory:   Normal respiratory effort without tachypnea/retractions. Breath sounds  are clear and equal bilaterally. No wheezes/rales/rhonchi. Gastrointestinal:   Soft and nontender. Non distended. There is no CVA tenderness.  No rebound, rigidity, or guarding.  Musculoskeletal:   Normal range of motion in all extremities. No joint effusions.  No lower extremity tenderness.  No edema.  Chest wall nontender Neurologic:   Normal speech and language.  Motor grossly intact. No acute focal neurologic deficits are appreciated.  Skin:    Skin is warm, dry and intact. No rash noted.  No petechiae, purpura, or bullae.  ____________________________________________    LABS (pertinent positives/negatives) (all labs ordered are listed, but only abnormal results are displayed) Labs Reviewed  COMPREHENSIVE METABOLIC PANEL - Abnormal; Notable for the following  components:      Result Value   Potassium 3.2 (*)    Glucose, Bld 262 (*)    All other components within normal limits  TROPONIN I (HIGH SENSITIVITY) - Abnormal; Notable for the following components:   Troponin I (High Sensitivity) 269 (*)    All other components within normal limits  SARS CORONAVIRUS 2 (TAT 6-24 HRS)  LIPASE, BLOOD  CBC WITH DIFFERENTIAL/PLATELET  PROTIME-INR  APTT  HEPARIN LEVEL (UNFRACTIONATED)  CBC  TROPONIN I (HIGH SENSITIVITY)   ____________________________________________   EKG  Interpreted by me Sinus rhythm rate of 98, normal axis.  Prolonged QTC of 530 ms.  Normal QRS ST segments and T waves.  ____________________________________________    RADIOLOGY  DG Chest 2 View  Result Date: 11/30/2019 CLINICAL DATA:  Chest pain EXAM: CHEST - 2 VIEW COMPARISON:  None. FINDINGS: The heart size and mediastinal contours are within normal limits. Both lungs are clear. The visualized skeletal structures are unremarkable. IMPRESSION: No acute abnormality of the lungs. Electronically Signed   By: Lauralyn Primes M.D.   On: 11/30/2019 16:11    ____________________________________________   PROCEDURES .Critical Care Performed by: Sharman Cheek, MD Authorized by: Sharman Cheek, MD   Critical care provider statement:    Critical care time (minutes):  35   Critical care time was exclusive of:  Separately billable procedures and treating other patients   Critical care was necessary to treat or prevent imminent or life-threatening deterioration of the following conditions:  Cardiac failure   Critical care was time spent personally by me on the following activities:  Development of treatment plan with patient or surrogate, discussions with consultants, evaluation of patient's response to treatment, examination of patient, obtaining history from patient or surrogate, ordering and performing treatments and interventions, ordering and review of laboratory studies,  ordering and review of radiographic studies, pulse oximetry, re-evaluation of patient's condition and review of old charts    ____________________________________________  DIFFERENTIAL DIAGNOSIS   Non-STEMI, GERD, muscle spasm, anxiety  CLINICAL IMPRESSION / ASSESSMENT AND PLAN / ED COURSE  Medications ordered in the ED: Medications  heparin bolus via infusion 4,000 Units (has no administration in time range)  heparin ADULT infusion 100 units/mL (25000 units/211mL sodium chloride 0.45%) (has no administration in time range)  aspirin chewable tablet 324 mg (has no administration in time range)    Pertinent labs & imaging results that were available during my care of the patient were reviewed by me and considered in my medical decision making (see chart for details).  Jamie Roach was evaluated in Emergency Department on 12/01/2019 for the symptoms described in the history of present illness. He was evaluated in the context of the global COVID-19 pandemic, which necessitated consideration that the patient might be at risk for infection  with the SARS-CoV-2 virus that causes COVID-19. Institutional protocols and algorithms that pertain to the evaluation of patients at risk for COVID-19 are in a state of rapid change based on information released by regulatory bodies including the CDC and federal and state organizations. These policies and algorithms were followed during the patient's care in the ED.   Patient presents with atypical chest pain, currently resolved after lasting 20 minutes with onset during rest. Considering the patient's symptoms, medical history, and physical examination today, I have low suspicion for ACS, PE, TAD, pneumothorax, carditis, mediastinitis, pneumonia, CHF, or sepsis.  With his recent symptoms and medical history, I will observe in ED and obtain serial troponins.  If symptoms remain resolved and work-up is reassuring I think he can be discharged to continue follow-up  outpatient.  Clinical Course as of Nov 30 420  Fri Dec 01, 2019  0224 Reviewed chest x-ray obtained yesterday morning at 11 AM, unremarkable.  No need to repeat based on current symptoms and exam.   [PS]    Clinical Course User Index [PS] Carrie Mew, MD    ----------------------------------------- 4:21 AM on 12/01/2019 -----------------------------------------  First troponin was within normal at 15.  Second troponin elevated at 269, concerning for non-STEMI.  Patient reports pain is still almost resolved, about 1/10.  With lab findings, will start on heparin and plan to admit.   ____________________________________________   FINAL CLINICAL IMPRESSION(S) / ED DIAGNOSES    Final diagnoses:  Atypical chest pain  Type 1 diabetes mellitus without complication (HCC)  NSTEMI (non-ST elevated myocardial infarction) [facility])     ED Discharge Orders    None      Portions of this note were generated with dragon dictation software. Dictation errors may occur despite best attempts at proofreading.   Carrie Mew, MD 12/01/19 0424    Carrie Mew, MD 12/01/19 [phone]

## 2019-12-02 LAB — CBC
HCT: 43.8 % (ref 39.0–52.0)
Hemoglobin: 14.4 g/dL (ref 13.0–17.0)
MCH: 30.1 pg (ref 26.0–34.0)
MCHC: 32.9 g/dL (ref 30.0–36.0)
MCV: 91.6 fL (ref 80.0–100.0)
Platelets: 289 10*3/uL (ref 150–400)
RBC: 4.78 MIL/uL (ref 4.22–5.81)
RDW: 12.2 % (ref 11.5–15.5)
WBC: 10.4 10*3/uL (ref 4.0–10.5)
nRBC: 0 % (ref 0.0–0.2)

## 2019-12-02 LAB — BASIC METABOLIC PANEL
Anion gap: 9 (ref 5–15)
BUN: 10 mg/dL (ref 6–20)
CO2: 24 mmol/L (ref 22–32)
Calcium: 8.3 mg/dL — ABNORMAL LOW (ref 8.9–10.3)
Chloride: 105 mmol/L (ref 98–111)
Creatinine, Ser: 0.78 mg/dL (ref 0.61–1.24)
GFR calc Af Amer: >60 mL/min (ref >60)
GFR calc non Af Amer: >60 mL/min (ref >60)
Glucose, Bld: 103 mg/dL — ABNORMAL HIGH (ref 70–99)
Potassium: 4 mmol/L (ref 3.5–5.1)
Sodium: 138 mmol/L (ref 135–145)

## 2019-12-02 LAB — GLUCOSE, CAPILLARY
Glucose-Capillary: 90 mg/dL (ref 70–99)
Glucose-Capillary: 101 mg/dL — ABNORMAL HIGH (ref 70–99)

## 2019-12-02 MED ORDER — ATORVASTATIN CALCIUM 80 MG PO TABS: 80.0000 mg | ORAL_TABLET | Freq: Every day | ORAL | 0 refills | Status: AC

## 2019-12-02 MED ORDER — ASPIRIN 81 MG PO TBEC: 81.0000 mg | DELAYED_RELEASE_TABLET | Freq: Every day | ORAL | 0 refills | Status: AC

## 2019-12-02 MED ORDER — PRASUGREL HCL 10 MG PO TABS: 10.0000 mg | ORAL_TABLET | Freq: Every day | ORAL | 0 refills | Status: AC

## 2019-12-02 MED ORDER — METOPROLOL TARTRATE 25 MG PO TABS: 12.5000 mg | ORAL_TABLET | Freq: Two times a day (BID) | ORAL | 0 refills | Status: AC

## 2019-12-02 NOTE — Plan of Care (Signed)

## 2019-12-02 NOTE — Plan of Care (Signed)
  Problem: Education: Goal: Knowledge of General Education information will improve Description: Including pain rating scale, medication(s)/side effects and non-pharmacologic comfort measures 12/02/2019 1036 by Blenda Bridegroom, RN Outcome: Completed/Met 12/02/2019 0750 by Blenda Bridegroom, RN Outcome: Progressing   Problem: Health Behavior/Discharge Planning: Goal: Ability to manage health-related needs will improve 12/02/2019 1036 by Blenda Bridegroom, RN Outcome: Completed/Met 12/02/2019 0750 by Blenda Bridegroom, RN Outcome: Progressing   Problem: Clinical Measurements: Goal: Ability to maintain clinical measurements within normal limits will improve 12/02/2019 1036 by Blenda Bridegroom, RN Outcome: Completed/Met 12/02/2019 0750 by Blenda Bridegroom, RN Outcome: Progressing Goal: Will remain free from infection 12/02/2019 1036 by Blenda Bridegroom, RN Outcome: Completed/Met 12/02/2019 0750 by Blenda Bridegroom, RN Outcome: Progressing Goal: Diagnostic test results will improve 12/02/2019 1036 by Blenda Bridegroom, RN Outcome: Completed/Met 12/02/2019 0750 by Blenda Bridegroom, RN Outcome: Progressing Goal: Respiratory complications will improve 12/02/2019 1036 by Blenda Bridegroom, RN Outcome: Completed/Met 12/02/2019 0750 by Blenda Bridegroom, RN Outcome: Progressing Goal: Cardiovascular complication will be avoided 12/02/2019 1036 by Blenda Bridegroom, RN Outcome: Completed/Met 12/02/2019 0750 by Blenda Bridegroom, RN Outcome: Progressing   Problem: Activity: Goal: Risk for activity intolerance will decrease 12/02/2019 1036 by Blenda Bridegroom, RN Outcome: Completed/Met 12/02/2019 0750 by Blenda Bridegroom, RN Outcome: Progressing   Problem: Nutrition: Goal: Adequate nutrition will be maintained 12/02/2019 1036 by Blenda Bridegroom, RN Outcome: Completed/Met 12/02/2019 0750 by Blenda Bridegroom, RN Outcome: Progressing   Problem: Coping: Goal: Level of anxiety will decrease 12/02/2019 1036 by Blenda Bridegroom, RN Outcome:  Completed/Met 12/02/2019 0750 by Blenda Bridegroom, RN Outcome: Progressing   Problem: Elimination: Goal: Will not experience complications related to bowel motility 12/02/2019 1036 by Blenda Bridegroom, RN Outcome: Completed/Met 12/02/2019 0750 by Blenda Bridegroom, RN Outcome: Progressing Goal: Will not experience complications related to urinary retention 12/02/2019 1036 by Blenda Bridegroom, RN Outcome: Completed/Met 12/02/2019 0750 by Blenda Bridegroom, RN Outcome: Progressing   Problem: Pain Managment: Goal: General experience of comfort will improve 12/02/2019 1036 by Blenda Bridegroom, RN Outcome: Completed/Met 12/02/2019 0750 by Blenda Bridegroom, RN Outcome: Progressing   Problem: Safety: Goal: Ability to remain free from injury will improve 12/02/2019 1036 by Blenda Bridegroom, RN Outcome: Completed/Met 12/02/2019 0750 by Blenda Bridegroom, RN Outcome: Progressing   Problem: Skin Integrity: Goal: Risk for impaired skin integrity will decrease 12/02/2019 1036 by Blenda Bridegroom, RN Outcome: Completed/Met 12/02/2019 0750 by Blenda Bridegroom, RN Outcome: Progressing

## 2019-12-02 NOTE — Progress Notes (Signed)
[facility] Cardiology [facility] Encounter Note  Patient: Jamie Roach / Admit Date: 12/01/2019 / Date of Encounter: 12/02/2019, 6:55 AM   Subjective: Patient had full resolution of chest discomfort and no new EKG changes.  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 rate 18, height 5\' 8"  (1.727 m), weight 76.7 kg, SpO2 98 %. Body mass index is 25.73 kg/m. General: Well developed, well nourished, in no acute distress. Head: Normocephalic, atraumatic, sclera non-icteric, no xanthomas, nares are without discharge. Neck: No apparent masses Lungs: Normal respirations with no wheezes, no rhonchi, no rales , no crackles   Heart: Regular rate and rhythm, normal S1 S2, no murmur, no rub, no gallop, PMI is normal size and placement, carotid upstroke normal without bruit, jugular venous pressure normal Abdomen: Soft, non-tender, non-distended with normoactive bowel sounds. No hepatosplenomegaly. Abdominal aorta is normal size without bruit Extremities: No edema, no clubbing, no cyanosis, no ulcers,  Peripheral: 2+ radial, 2+ femoral, 2+ dorsal pedal pulses Neuro: Alert and oriented. Moves all extremities spontaneously. Psych:  Responds to questions appropriately with a normal  affect.   Intake/Output Summary (Last 24 hours) at 12/02/2019 0655 Last data filed at 12/02/2019 0409 Gross per 24 hour  Intake 746.2 ml  Output 950 ml  Net -203.8 ml    Inpatient Medications:  . aspirin EC  81 mg Oral Daily  . atorvastatin  80 mg Oral q1800  . cholecalciferol  1,000 Units Oral Daily  . insulin aspart  0-9 Units Subcutaneous Q6H  . insulin pump   Subcutaneous Q4H  . metoprolol tartrate  12.5 mg Oral BID  . multivitamin with minerals  1 tablet Oral Daily  . nitroGLYCERIN  0.5 inch Topical Q6H  . potassium chloride  40 mEq Oral Once  . prasugrel  10 mg Oral Daily  . sodium chloride flush  3 mL Intravenous Q12H  . sodium chloride flush  3 mL Intravenous Q12H   Infusions:  . sodium chloride      Labs: Recent Labs    12/01/19 0139 12/01/19 0333 12/01/19 1018  NA 136  --  134*  K 3.2*  --  4.3  CL 100  --  101  CO2 23  --  24  GLUCOSE 262*  --  234*  BUN 14  --  10  CREATININE 1.00  --  0.91  CALCIUM 9.0  --  8.5*  MG  --  2.1  --    Recent Labs    12/01/19 0139  AST 17  ALT 24  ALKPHOS 70  BILITOT 1.0  PROT 6.9  ALBUMIN 4.2   Recent Labs    12/01/19 0139 12/01/19 0139 12/01/19 0442 12/01/19 1018  WBC 6.2   < > 8.4 10.1  NEUTROABS 3.4  --   --   --   HGB 14.8   < > 15.1 15.6  HCT 43.6   < > 44.3 46.2  MCV 89.9   < >  89.7 89.7  PLT 310   < > 321 339   < > = values in this interval not displayed.   No results for input(s): CKTOTAL, CKMB, TROPONINI in the last 72 hours. Invalid input(s): POCBNP Recent Labs    12/01/19 0333  HGBA1C 6.7*     Weights: Filed Weights   12/01/19 0136 12/01/19 1659 12/02/19 0403  Weight: 72.8 kg 79.8 kg 76.7 kg     Radiology/Studies:  DG Chest 2 View  Result Date: 11/30/2019 CLINICAL DATA:  Chest pain EXAM: CHEST - 2 VIEW COMPARISON:  None. FINDINGS: The heart size and mediastinal contours are within normal limits. Both lungs are clear. The visualized skeletal structures are unremarkable.  IMPRESSION: No acute abnormality of the lungs. Electronically Signed   By: Eddie Candle M.D.   On: 11/30/2019 16:11   CT ANGIO CHEST PE W OR WO CONTRAST  Result Date: 12/01/2019 CLINICAL DATA:  Central chest pain, radiating to back EXAM: CT ANGIOGRAPHY CHEST WITH CONTRAST TECHNIQUE: Multidetector CT imaging of the chest was performed using the standard protocol during bolus administration of intravenous contrast. Multiplanar CT image reconstructions and MIPs were obtained to evaluate the vascular anatomy. CONTRAST:  15mL OMNIPAQUE IOHEXOL 350 MG/ML SOLN COMPARISON:  Chest radiographs, 11/30/2019 FINDINGS: Cardiovascular: Preferential opacification of the thoracic aorta. Normal contour and caliber of the thoracic aorta. No evidence of aneurysm or dissection. No significant aortic atherosclerosis. No evidence of central pulmonary embolism on this non tailored examination normal heart size. Left coronary artery calcifications. No pericardial effusion. Mediastinum/Nodes: No enlarged mediastinal, hilar, or axillary lymph nodes. Thyroid gland, trachea, and esophagus demonstrate no significant findings. Lungs/Pleura: Dependent bibasilar partial atelectasis and/or scarring. No pleural effusion or pneumothorax. Upper Abdomen: No acute abnormality. Musculoskeletal: No chest wall abnormality. No acute or significant osseous findings. Review of the MIP images confirms the above findings. IMPRESSION: 1. No CT evidence of thoracic aortic dissection, aneurysm, or other acute aortic pathology. No significant aortic atherosclerosis. 2. Left coronary artery calcifications. Electronically Signed   By: Eddie Candle M.D.   On: 12/01/2019 08:46   CARDIAC CATHETERIZATION  Result Date: 12/01/2019  Prox LAD lesion is 85% stenosed.  54 year old male with essential hypertension mixed hyperlipidemia diabetes with progressive episode of chest discomfort and non-ST elevation myocardial infarction Left ventricle with minimal apical  hypokinesis with ejection fraction of 55% Significant culprit lesion of proximal left anterior descending artery of 85 to 90% Other coronary arteries appear normal Plan PCI and stent placement of proximal left anterior descending artery Dual antiplatelet therapy High intensity cholesterol therapy Beta-blocker ACE inhibitor as able Cardiac rehabilitation   CARDIAC CATHETERIZATION  Result Date: 12/01/2019  Ost LAD to Prox LAD lesion is 95% stenosed.  Mid LAD lesion is 70% stenosed.  A drug-eluting stent was successfully placed using a STENT RESOLUTE ONYX 3.5X34.  Post intervention, there is a 0% residual stenosis.  Post intervention, there is a 0% residual stenosis.  Successful IVUS guided PCI and drug-eluting stent placement to the ostial/proximal LAD extending into the mid segment to cover a second lesion that was borderline significant by IVUS. Recommendations: Aspirin 81 mg daily indefinitely. Prasugrel 10 mg daily for at least 1 year. Aggressive treatment of risk factors and risk factor modifications.     Assessment and Recommendation  54 y.o. male with diabetes hypertension hyperlipidemia with non-ST elevation myocardial infarction and significant new stenosis of 85% of proximal left anterior descending artery Now status post PCI and stent placement of left anterior descending artery without  complication  1.  Begin ambulation and follow for improvements of symptoms this a.m. 2.  High intensity cholesterol therapy 3.  Dual antiplatelet therapy 4.  Beta-blocker ACE inhibitor if able depending on blood pressure 5.  Begin reading cardiac rehabilitation 6.  If ambulating well okay for discharge home today on current medical regimen with follow-up in 1 to 2 weeks Signed, Arnoldo Hooker M.D. FACC

## 2019-12-02 NOTE — Discharge Summary (Signed)
Physician Discharge Summary  Jamie Roach LKJ:[id number] DOB: 02-09-1966 DOA: 12/01/2019  PCP: Jaclyn Shaggy, MD  Admit date: 12/01/2019 Discharge date: 12/02/2019  Admitted From: Home Disposition: Home  Recommendations for Outpatient Follow-up:  1. Follow up with PCP in 1-2 weeks 2. Follow-up with cardiology in 1 to 2 weeks  Home Health: No Equipment/Devices: None  Discharge Condition: Stable CODE STATUS: Full Diet recommendation: Heart Healthy  Brief/Interim Summary: Jamie Roach  is a 54 y.o. Caucasian male with a known history of type 1 diabetes mellitus, presented to the emergency room with acute onset of intermittent chest pain over the last couple weeks that got significantly worse at 12:15 AM tonight it was felt as burning sensation between both shoulder blades and radiated anteriorly to midsternal area.  He graded at 4-5/10 in severity.  He admitted to diaphoresis in his feet and palms with without nausea or vomiting or palpitations.  He denied any cough or wheezing or hemoptysis.  No COVID-19 exposure.  He denied any leg pain or recent surgery.  He went to [employer] about 4 weeks ago.  No abdominal pain or diarrhea or melena or bright red bleeding per rectum.  No bleeding diathesis.  Upon presentation to the emergency room, blood pressure was 172/88 with otherwise normal vital signs.  Labs revealed mild hyponatremia 3.2 and blood glucose was 262.  High-sensitivity troponin I was 15 and later 269.  CBC was unremarkable.  COVID-19 PCR is currently pending.  Two-view chest x-ray showed no acute cardiopulmonary disease.  EKG showed normal sinus rhythm with rate of 98 with right axis deviation and right bundle branch block with prolonged QTc interval of 530 ms.  The patient took 4 baby aspirin at home and by the time he arrived here his pain was down to 1/10 in severity.  He was given a bolus of IV heparin followed by IV heparin drip.  He will be admitted to a telemetry bed for  further evaluation and management.  3/13: Cardiology consulted, case discussed with Dr. Gwen Pounds.  Considering the patient's troponin elevation symptoms consistent with NSTEMI he underwent left heart catheterization on 12/01/2019.  Revealed 85% stenosis of proximal LAD.  Status post PCI and placement of 1 drug-eluting stent.  Tolerated procedure well without issue.  Started on dual antiplatelet therapy, high intensity statin therapy, beta-blocker.  After evaluation by hospitalist as well as cardiologist patient is stable for discharge home.  He is instructed to begin his goal-directed medical therapy for coronary artery disease.  All questions were answered.  Patient expressed understanding.  Patient will follow up with cardiology in 1 to 2 weeks post discharge.  All prescription sent to outpatient pharmacy.  Discharge Diagnoses:  Active Problems:   NSTEMI (non-ST elevated myocardial infarction) [facility])  NSTEMI Patient underwent successful left heart catheterization on 12/01/2019 85% stenosis of proximal LAD noted PCI and drug-eluting stent deployed Tolerated procedure well Start dual antiplatelet therapy Start high intensity statin therapy Metoprolol tartrate 12.5 twice daily.  Unable to titrate further at this time Defer addition of ACE inhibitor at this time.  Blood pressure does not allow.  Patient will follow up with cardiologist in the office in 1 to 2 weeks.  Will likely need addition of ACE inhibitor at that time Refer for cardiac rehab  Diabetes mellitus type 1 Can resume insulin pump regimen   Discharge Instructions  Discharge Instructions    AMB Referral to Cardiac Rehabilitation - Phase II   Complete by: As directed  Diagnosis:  Coronary Stents NSTEMI     After initial evaluation and assessments completed: Virtual Based Care may be provided alone or in conjunction with Phase 2 Cardiac Rehab based on patient barriers.: Yes   Diet - low sodium heart healthy   Complete by: As  directed    Increase activity slowly   Complete by: As directed      Allergies as of 12/02/2019      Reactions   Penicillins       Medication List    TAKE these medications   aspirin 81 MG EC tablet Take 1 tablet (81 mg total) by mouth daily. Start taking on: December 03, 2019   atorvastatin 80 MG tablet Commonly known as: LIPITOR Take 1 tablet (80 mg total) by mouth daily at 6 PM.   Cholecalciferol 25 MCG (1000 UT) capsule Take by mouth.   metoprolol tartrate 25 MG tablet Commonly known as: LOPRESSOR Take 0.5 tablets (12.5 mg total) by mouth 2 (two) times daily.   Multi-Vitamin tablet Take by mouth.   NovoLOG 100 UNIT/ML injection Generic drug: insulin aspart SMARTSIG:75 Unit(s) SUB-Q Daily   prasugrel 10 MG Tabs tablet Commonly known as: EFFIENT Take 1 tablet (10 mg total) by mouth daily. Start taking on: December 03, 2019      Follow-up Information    Lamar Blinks, MD. Schedule an appointment as soon as possible for a visit in 1 week(s).   Specialty: Cardiology Contact information: [address] [facility] [place] [address] [phone]          Allergies  Allergen Reactions  . Penicillins     Consultations:  Cardiology-[facility]   Procedures/Studies: DG Chest 2 View  Result Date: 11/30/2019 CLINICAL DATA:  Chest pain EXAM: CHEST - 2 VIEW COMPARISON:  None. FINDINGS: The heart size and mediastinal contours are within normal limits. Both lungs are clear. The visualized skeletal structures are unremarkable. IMPRESSION: No acute abnormality of the lungs. Electronically Signed   By: Lauralyn Primes M.D.   On: 11/30/2019 16:11   CT ANGIO CHEST PE W OR WO CONTRAST  Result Date: 12/01/2019 CLINICAL DATA:  Central chest pain, radiating to back EXAM: CT ANGIOGRAPHY CHEST WITH CONTRAST TECHNIQUE: Multidetector CT imaging of the chest was performed using the standard protocol during bolus administration of intravenous  contrast. Multiplanar CT image reconstructions and MIPs were obtained to evaluate the vascular anatomy. CONTRAST:  40mL OMNIPAQUE IOHEXOL 350 MG/ML SOLN COMPARISON:  Chest radiographs, 11/30/2019 FINDINGS: Cardiovascular: Preferential opacification of the thoracic aorta. Normal contour and caliber of the thoracic aorta. No evidence of aneurysm or dissection. No significant aortic atherosclerosis. No evidence of central pulmonary embolism on this non tailored examination normal heart size. Left coronary artery calcifications. No pericardial effusion. Mediastinum/Nodes: No enlarged mediastinal, hilar, or axillary lymph nodes. Thyroid gland, trachea, and esophagus demonstrate no significant findings. Lungs/Pleura: Dependent bibasilar partial atelectasis and/or scarring. No pleural effusion or pneumothorax. Upper Abdomen: No acute abnormality. Musculoskeletal: No chest wall abnormality. No acute or significant osseous findings. Review of the MIP images confirms the above findings. IMPRESSION: 1. No CT evidence of thoracic aortic dissection, aneurysm, or other acute aortic pathology. No significant aortic atherosclerosis. 2. Left coronary artery calcifications. Electronically Signed   By: Lauralyn Primes M.D.   On: 12/01/2019 08:46   CARDIAC CATHETERIZATION  Result Date: 12/01/2019  Prox LAD lesion is 85% stenosed.  54 year old male with essential hypertension mixed hyperlipidemia diabetes with progressive episode of chest discomfort and non-ST  elevation myocardial infarction Left ventricle with minimal apical hypokinesis with ejection fraction of 55% Significant culprit lesion of proximal left anterior descending artery of 85 to 90% Other coronary arteries appear normal Plan PCI and stent placement of proximal left anterior descending artery Dual antiplatelet therapy High intensity cholesterol therapy Beta-blocker ACE inhibitor as able Cardiac rehabilitation   CARDIAC CATHETERIZATION  Result Date: 12/01/2019  Ost  LAD to Prox LAD lesion is 95% stenosed.  Mid LAD lesion is 70% stenosed.  A drug-eluting stent was successfully placed using a STENT RESOLUTE ONYX 3.5X34.  Post intervention, there is a 0% residual stenosis.  Post intervention, there is a 0% residual stenosis.  Successful IVUS guided PCI and drug-eluting stent placement to the ostial/proximal LAD extending into the mid segment to cover a second lesion that was borderline significant by IVUS. Recommendations: Aspirin 81 mg daily indefinitely. Prasugrel 10 mg daily for at least 1 year. Aggressive treatment of risk factors and risk factor modifications.    (Echo, Carotid, EGD, Colonoscopy, ERCP)    Subjective: Seen and examined on the day of discharge Post left heart cath and PCI Tolerated well, no chest pain Stable for discharge home  Discharge Exam: Vitals:   12/02/19 0715 12/02/19 0756  BP:  126/64  Pulse:  66  Resp: 16 17  Temp:  98.5 F (36.9 C)  SpO2:  98%   Vitals:   12/01/19 1913 12/02/19 0403 12/02/19 0715 12/02/19 0756  BP: 112/64 121/65  126/64  Pulse: 91 68  66  Resp: 18 18 16 17   Temp: 98.3 F (36.8 C) 98.6 F (37 C)  98.5 F (36.9 C)  TempSrc: Oral Oral  Oral  SpO2: 98% 98%  98%  Weight:  76.7 kg    Height:        General: Pt is alert, awake, not in acute distress Cardiovascular: RRR, S1/S2 +, no rubs, no gallops Respiratory: CTA bilaterally, no wheezing, no rhonchi Abdominal: Soft, NT, ND, bowel sounds + Extremities: no edema, no cyanosis    The results of significant diagnostics from this hospitalization (including imaging, microbiology, ancillary and laboratory) are listed below for reference.     Microbiology: Recent Results (from the past 240 hour(s))  SARS CORONAVIRUS 2 (TAT 6-24 HRS) Nasopharyngeal Nasopharyngeal Swab     Status: None   Collection Time: 12/01/19  4:42 AM   Specimen: Nasopharyngeal Swab  Result Value Ref Range Status   SARS Coronavirus 2 NEGATIVE NEGATIVE Final    Comment:  (NOTE) SARS-CoV-2 target nucleic acids are NOT DETECTED. The SARS-CoV-2 RNA is generally detectable in upper and lower respiratory specimens during the acute phase of infection. Negative results do not preclude SARS-CoV-2 infection, do not rule out co-infections with other pathogens, and should not be used as the sole basis for treatment or other patient management decisions. Negative results must be combined with clinical observations, patient history, and epidemiological information. The expected result is Negative. Fact Sheet for Patients: SugarRoll.be Fact Sheet for Healthcare Providers: https://www.woods-mathews.com/ This test is not yet approved or cleared by the [place] FDA and  has been authorized for detection and/or diagnosis of SARS-CoV-2 by FDA under an Emergency Use Authorization (EUA). This EUA will remain  in effect (meaning this test can be used) for the duration of the COVID-19 declaration under Section 56 4(b)(1) of the Act, 21 U.S.C. section 360bbb-3(b)(1), unless the authorization is terminated or revoked sooner. Performed at [facility], [place] [address] [place], [place] [address]  Labs: BNP (last 3 results) No results for input(s): BNP in the last 8760 hours. Basic Metabolic Panel: Recent Labs  Lab 12/01/19 0139 12/01/19 0333 12/01/19 1018 12/02/19 0813  NA 136  --  134* 138  K 3.2*  --  4.3 4.0  CL 100  --  101 105  CO2 23  --  24 24  GLUCOSE 262*  --  234* 103*  BUN 14  --  10 10  CREATININE 1.00  --  0.91 0.78  CALCIUM 9.0  --  8.5* 8.3*  MG  --  2.1  --   --    Liver Function Tests: Recent Labs  Lab 12/01/19 0139  AST 17  ALT 24  ALKPHOS 70  BILITOT 1.0  PROT 6.9  ALBUMIN 4.2   Recent Labs  Lab 12/01/19 0139  LIPASE 30   No results for input(s): AMMONIA in the last 168 hours. CBC: Recent Labs  Lab 12/01/19 0139 12/01/19 0442 12/01/19 1018 12/02/19 0813  WBC 6.2 8.4  10.1 10.4  NEUTROABS 3.4  --   --   --   HGB 14.8 15.1 15.6 14.4  HCT 43.6 44.3 46.2 43.8  MCV 89.9 89.7 89.7 91.6  PLT 310 321 339 289   Cardiac Enzymes: No results for input(s): CKTOTAL, CKMB, CKMBINDEX, TROPONINI in the last 168 hours. BNP: Invalid input(s): POCBNP CBG: Recent Labs  Lab 12/01/19 1733 12/01/19 2007 12/01/19 2357 12/02/19 0405 12/02/19 0756  GLUCAP 188* 172* 112* 90 101*   D-Dimer No results for input(s): DDIMER in the last 72 hours. Hgb A1c Recent Labs    12/01/19 0333  HGBA1C 6.7*   Lipid Profile Recent Labs    12/01/19 0333  CHOL 193  HDL 49  LDLCALC 140*  TRIG 22  CHOLHDL 3.9   Thyroid function studies No results for input(s): TSH, T4TOTAL, T3FREE, THYROIDAB in the last 72 hours.  Invalid input(s): FREET3 Anemia work up No results for input(s): VITAMINB12, FOLATE, FERRITIN, TIBC, IRON, RETICCTPCT in the last 72 hours. Urinalysis No results found for: COLORURINE, APPEARANCEUR, LABSPEC, PHURINE, GLUCOSEU, HGBUR, BILIRUBINUR, KETONESUR, PROTEINUR, UROBILINOGEN, NITRITE, LEUKOCYTESUR Sepsis Labs Invalid input(s): PROCALCITONIN,  WBC,  LACTICIDVEN Microbiology Recent Results (from the past 240 hour(s))  SARS CORONAVIRUS 2 (TAT 6-24 HRS) Nasopharyngeal Nasopharyngeal Swab     Status: None   Collection Time: 12/01/19  4:42 AM   Specimen: Nasopharyngeal Swab  Result Value Ref Range Status   SARS Coronavirus 2 NEGATIVE NEGATIVE Final    Comment: (NOTE) SARS-CoV-2 target nucleic acids are NOT DETECTED. The SARS-CoV-2 RNA is generally detectable in upper and lower respiratory specimens during the acute phase of infection. Negative results do not preclude SARS-CoV-2 infection, do not rule out co-infections with other pathogens, and should not be used as the sole basis for treatment or other patient management decisions. Negative results must be combined with clinical observations, patient history, and epidemiological information. The  expected result is Negative. Fact Sheet for Patients: HairSlick.no Fact Sheet for Healthcare Providers: quierodirigir.com This test is not yet approved or cleared by the [place] FDA and  has been authorized for detection and/or diagnosis of SARS-CoV-2 by FDA under an Emergency Use Authorization (EUA). This EUA will remain  in effect (meaning this test can be used) for the duration of the COVID-19 declaration under Section 56 4(b)(1) of the Act, 21 U.S.C. section 360bbb-3(b)(1), unless the authorization is terminated or revoked sooner. Performed at [facility], [address] [place], [place] [address]  Time coordinating discharge: Over 30 minutes  SIGNED:   Tresa MooreSudheer B Fatisha Rabalais, MD  [facility] 12/02/2019, 3:11 PM Pager   If 7PM-7AM, please contact night-coverage

## 2019-12-02 NOTE — Progress Notes (Signed)
The nurse reviewed discharge paperwork with the patient.  The patient had no questions regarding discharge paperwork and medications.

## 2019-12-04 ENCOUNTER — Encounter: Payer: Self-pay | Admitting: Cardiology

## 2019-12-04 LAB — POCT ACTIVATED CLOTTING TIME
Activated Clotting Time: 279 seconds
Activated Clotting Time: 246 seconds

## 2020-01-04 ENCOUNTER — Encounter: Payer: BLUE CROSS/BLUE SHIELD | Attending: Cardiology | Admitting: *Deleted

## 2020-01-04 ENCOUNTER — Other Ambulatory Visit: Payer: Self-pay

## 2020-01-04 DIAGNOSIS — Z794 Long term (current) use of insulin: Secondary | ICD-10-CM | POA: Insufficient documentation

## 2020-01-04 DIAGNOSIS — Z7982 Long term (current) use of aspirin: Secondary | ICD-10-CM | POA: Insufficient documentation

## 2020-01-04 DIAGNOSIS — I214 Non-ST elevation (NSTEMI) myocardial infarction: Secondary | ICD-10-CM | POA: Insufficient documentation

## 2020-01-04 DIAGNOSIS — Z955 Presence of coronary angioplasty implant and graft: Secondary | ICD-10-CM | POA: Insufficient documentation

## 2020-01-04 DIAGNOSIS — Z79899 Other long term (current) drug therapy: Secondary | ICD-10-CM | POA: Insufficient documentation

## 2020-01-04 DIAGNOSIS — E109 Type 1 diabetes mellitus without complications: Secondary | ICD-10-CM | POA: Insufficient documentation

## 2020-01-04 NOTE — Progress Notes (Signed)
Initial telephone orientation completed. Diagnosis can be found in [facility] 3/12. EP Orientation scheduled for 4/19

## 2020-01-08 ENCOUNTER — Encounter: Payer: BLUE CROSS/BLUE SHIELD | Admitting: *Deleted

## 2020-01-08 ENCOUNTER — Other Ambulatory Visit: Payer: Self-pay

## 2020-01-08 VITALS — Ht 69.75 in | Wt 168.5 lb

## 2020-01-08 DIAGNOSIS — Z794 Long term (current) use of insulin: Secondary | ICD-10-CM | POA: Diagnosis not present

## 2020-01-08 DIAGNOSIS — I214 Non-ST elevation (NSTEMI) myocardial infarction: Secondary | ICD-10-CM

## 2020-01-08 DIAGNOSIS — Z7982 Long term (current) use of aspirin: Secondary | ICD-10-CM | POA: Diagnosis not present

## 2020-01-08 DIAGNOSIS — Z955 Presence of coronary angioplasty implant and graft: Secondary | ICD-10-CM

## 2020-01-08 DIAGNOSIS — Z79899 Other long term (current) drug therapy: Secondary | ICD-10-CM | POA: Diagnosis not present

## 2020-01-08 DIAGNOSIS — E109 Type 1 diabetes mellitus without complications: Secondary | ICD-10-CM | POA: Diagnosis not present

## 2020-01-08 NOTE — Patient Instructions (Addendum)
Patient Instructions  Patient Details  Name: Jamie Roach MRN: [id number] Date of Birth: [birth year] Referring Provider:  Teodoro Spray, MD  Below are your personal goals for exercise, nutrition, and risk factors. Our goal is to help you stay on track towards obtaining and maintaining these goals. We will be discussing your progress on these goals with you throughout the program.  Initial Exercise Prescription: Initial Exercise Prescription - 01/08/20 0900      Date of Initial Exercise RX and Referring Provider   Date  01/08/20    Referring Provider  Bartholome Bill MD      Treadmill   MPH  2.6    Grade  1    Minutes  15    METs  3.3      Elliptical   Level  1    Speed  4.2    Minutes  15    METs  3      REL-XR   Level  3    Speed  50    Minutes  15    METs  3      T5 Nustep   Level  3    SPM  80    Minutes  15    METs  3      Prescription Details   Frequency (times per week)  2    Duration  Progress to 30 minutes of continuous aerobic without signs/symptoms of physical distress      Intensity   THRR 40-80% of Max Heartrate  107-148    Ratings of Perceived Exertion  11-13    Perceived Dyspnea  0-4      Progression   Progression  Continue to progress workloads to maintain intensity without signs/symptoms of physical distress.      Resistance Training   Training Prescription  Yes    Weight  4 lb    Reps  10-15       Exercise Goals: Frequency: Be able to perform aerobic exercise two to three times per week in program working toward 2-5 days per week of home exercise.  Intensity: Work with a perceived exertion of 11 (fairly light) - 15 (hard) while following your exercise prescription.  We will make changes to your prescription with you as you progress through the program.   Duration: Be able to do 30 to 45 minutes of continuous aerobic exercise in addition to a 5 minute warm-up and a 5 minute cool-down routine.   Nutrition Goals: Your  personal nutrition goals will be established when you do your nutrition analysis with the dietician.  The following are general nutrition guidelines to follow: Cholesterol < 200mg /day Sodium < 1500mg /day Fiber: Men over 50 yrs - 30 grams per day  Personal Goals: Personal Goals and Risk Factors at Admission - 01/08/20 0924      Core Components/Risk Factors/Patient Goals on Admission    Weight Management  Yes;Weight Maintenance    Intervention  Weight Management: Develop a combined nutrition and exercise program designed to reach desired caloric intake, while maintaining appropriate intake of nutrient and fiber, sodium and fats, and appropriate energy expenditure required for the weight goal.;Weight Management: Provide education and appropriate resources to help participant work on and attain dietary goals.    Admit Weight  168 lb 8 oz (76.4 kg)    Goal Weight: Short Term  168 lb (76.2 kg)    Goal Weight: Long Term  168 lb (76.2 kg)    Expected Outcomes  Short Term: Continue to assess and modify interventions until short term weight is achieved;Long Term: Adherence to nutrition and physical activity/exercise program aimed toward attainment of established weight goal;Weight Maintenance: Understanding of the daily nutrition guidelines, which includes 25-35% calories from fat, 7% or less cal from saturated fats, less than 200mg  cholesterol, less than 1.5gm of sodium, & 5 or more servings of fruits and vegetables daily    Diabetes  Yes    Intervention  Provide education about signs/symptoms and action to take for hypo/hyperglycemia.;Provide education about proper nutrition, including hydration, and aerobic/resistive exercise prescription along with prescribed medications to achieve blood glucose in normal ranges: Fasting glucose 65-99 mg/dL    Expected Outcomes  Short Term: Participant verbalizes understanding of the signs/symptoms and immediate care of hyper/hypoglycemia, proper foot care and  importance of medication, aerobic/resistive exercise and nutrition plan for blood glucose control.;Long Term: Attainment of HbA1C < 7%.    Lipids  Yes    Intervention  Provide education and support for participant on nutrition & aerobic/resistive exercise along with prescribed medications to achieve LDL 70mg , HDL >40mg .    Expected Outcomes  Short Term: Participant states understanding of desired cholesterol values and is compliant with medications prescribed. Participant is following exercise prescription and nutrition guidelines.;Long Term: Cholesterol controlled with medications as prescribed, with individualized exercise RX and with personalized nutrition plan. Value goals: LDL < 70mg , HDL > 40 mg.       Tobacco Use Initial Evaluation: Social History   Tobacco Use  Smoking Status Never Smoker  Smokeless Tobacco Never Used    Exercise Goals and Review: Exercise Goals    Row Name 01/08/20 0923             Exercise Goals   Increase Physical Activity  Yes       Intervention  Provide advice, education, support and counseling about physical activity/exercise needs.;Develop an individualized exercise prescription for aerobic and resistive training based on initial evaluation findings, risk stratification, comorbidities and participant's personal goals.       Expected Outcomes  Short Term: Attend rehab on a regular basis to increase amount of physical activity.;Long Term: Add in home exercise to make exercise part of routine and to increase amount of physical activity.;Long Term: Exercising regularly at least 3-5 days a week.       Increase Strength and Stamina  Yes       Intervention  Provide advice, education, support and counseling about physical activity/exercise needs.;Develop an individualized exercise prescription for aerobic and resistive training based on initial evaluation findings, risk stratification, comorbidities and participant's personal goals.       Expected Outcomes  Short  Term: Increase workloads from initial exercise prescription for resistance, speed, and METs.;Short Term: Perform resistance training exercises routinely during rehab and add in resistance training at home;Long Term: Improve cardiorespiratory fitness, muscular endurance and strength as measured by increased METs and functional capacity ( )       Able to understand and use rate of perceived exertion (RPE) scale  Yes       Intervention  Provide education and explanation on how to use RPE scale       Expected Outcomes  Short Term: Able to use RPE daily in rehab to express subjective intensity level;Long Term:  Able to use RPE to guide intensity level when exercising independently       Able to understand and use Dyspnea scale  Yes       Intervention  Provide education and explanation  on how to use Dyspnea scale       Expected Outcomes  Short Term: Able to use Dyspnea scale daily in rehab to express subjective sense of shortness of breath during exertion;Long Term: Able to use Dyspnea scale to guide intensity level when exercising independently       Knowledge and understanding of Target Heart Rate Range (THRR)  Yes       Intervention  Provide education and explanation of THRR including how the numbers were predicted and where they are located for reference       Expected Outcomes  Short Term: Able to state/look up THRR;Short Term: Able to use daily as guideline for intensity in rehab;Long Term: Able to use THRR to govern intensity when exercising independently       Able to check pulse independently  Yes       Intervention  Provide education and demonstration on how to check pulse in carotid and radial arteries.;Review the importance of being able to check your own pulse for safety during independent exercise       Expected Outcomes  Short Term: Able to explain why pulse checking is important during independent exercise;Long Term: Able to check pulse independently and accurately       Understanding of  Exercise Prescription  Yes       Intervention  Provide education, explanation, and written materials on patient's individual exercise prescription       Expected Outcomes  Long Term: Able to explain home exercise prescription to exercise independently;Short Term: Able to explain program exercise prescription          Copy of goals given to participant.

## 2020-01-08 NOTE — Progress Notes (Addendum)
Cardiac Individual Treatment Plan  Patient Details  Name: Jamie Roach MRN: [id number] Date of Birth: 1965/11/15 Referring Provider:     Cardiac Rehab from 01/08/2020 in [facility] Cardiac and Pulmonary Rehab  Referring Provider  Bartholome Bill MD      Initial Encounter Date:    Cardiac Rehab from 01/08/2020 in [facility] Cardiac and Pulmonary Rehab  Date  01/08/20      Visit Diagnosis: NSTEMI (non-ST elevated myocardial infarction) [facility])  Status post coronary artery stent placement  Patient's Home Medications on Admission:  Current Outpatient Medications:  .  aspirin 81 MG EC tablet, TAKE 1 TABLET(81 MG) BY MOUTH DAILY, Disp: , Rfl:  .  atorvastatin (LIPITOR) 80 MG tablet, Take 1 tablet (80 mg total) by mouth daily at 6 PM., Disp: 30 tablet, Rfl: 0 .  atorvastatin (LIPITOR) 80 MG tablet, TAKE 1 TABLET(80 MG) BY MOUTH DAILY AT 6 PM, Disp: , Rfl:  .  Cholecalciferol 25 MCG (1000 UT) capsule, Take by mouth., Disp: , Rfl:  .  Continuous Blood Gluc Receiver (FREESTYLE LIBRE 14 DAY READER) DEVI, Use 1 kit as directed E10.649, Disp: , Rfl:  .  Lancets (FREESTYLE) lancets, USE TO CHECK BLOOD SUGAR 6 TIMES A DAY, Disp: , Rfl:  .  metoprolol tartrate (LOPRESSOR) 25 MG tablet, Take 0.5 tablets (12.5 mg total) by mouth 2 (two) times daily., Disp: 30 tablet, Rfl: 0 .  Multiple Vitamin (MULTI-VITAMIN) tablet, Take by mouth., Disp: , Rfl:  .  NOVOLOG 100 UNIT/ML injection, SMARTSIG:75 Unit(s) SUB-Q Daily, Disp: , Rfl:  .  prasugrel (EFFIENT) 10 MG TABS tablet, Take by mouth., Disp: , Rfl:   Past Medical History: Past Medical History:  Diagnosis Date  . Type 1 diabetes (HCC)     Tobacco Use: Social History   Tobacco Use  Smoking Status Never Smoker  Smokeless Tobacco Never Used    Labs: Recent Review Scientist, physiological    Labs for ITP Cardiac and Pulmonary Rehab Latest Ref Rng & Units 12/01/2019   Cholestrol 0 - 200 mg/dL 193   LDLCALC 0 - 99 mg/dL 140(H)   HDL >40 mg/dL 49    Trlycerides <150 mg/dL 22   Hemoglobin A1c 4.8 - 5.6 % 6.7(H)       Exercise Target Goals: Exercise Program Goal: Individual exercise prescription set using results from initial 6 min walk test and THRR while considering  patient's activity barriers and safety.   Exercise Prescription Goal: Initial exercise prescription builds to 30-45 minutes a day of aerobic activity, 2-3 days per week.  Home exercise guidelines will be given to patient during program as part of exercise prescription that the participant will acknowledge.   Education: Aerobic Exercise & Resistance Training: - Gives group verbal and written instruction on the various components of exercise. Focuses on aerobic and resistive training programs and the benefits of this training and how to safely progress through these programs..   Education: Exercise & Equipment Safety: - Individual verbal instruction and demonstration of equipment use and safety with use of the equipment.   Cardiac Rehab from 01/08/2020 in [facility] Cardiac and Pulmonary Rehab  Date  01/08/20  Educator  [facility]  Instruction Review Code  1- Verbalizes Understanding      Education: Exercise Physiology & General Exercise Guidelines: - Group verbal and written instruction with models to review the exercise physiology of the cardiovascular system and associated critical values. Provides general exercise guidelines with specific guidelines to those with heart or lung disease.  Education: Flexibility, Balance, Mind/Body Relaxation: Provides group verbal/written instruction on the benefits of flexibility and balance training, including mind/body exercise modes such as yoga, pilates and tai chi.  Demonstration and skill practice provided.   Activity Barriers & Risk Stratification: Activity Barriers & Cardiac Risk Stratification - 01/08/20 0919      Activity Barriers & Cardiac Risk Stratification   Activity Barriers  Other (comment)    Comments  very nervous and  scared of exertion    Cardiac Risk Stratification  Moderate       6 Minute Walk: 6 Minute Walk    Row Name 01/08/20 0918         6 Minute Walk   Phase  Initial     Distance  1400 feet     Walk Time  6 minutes     # of Rest Breaks  0     MPH  2.65     METS  4.24     RPE  7     VO2 Peak  14.83     Symptoms  No     Resting HR  67 bpm     Resting BP  142/74 very nervous     Resting Oxygen Saturation   99 %     Exercise Oxygen Saturation  during 6 min walk  98 %     Max Ex. HR  93 bpm     Max Ex. BP  132/74     2 Minute Post BP  128/74        Oxygen Initial Assessment:   Oxygen Re-Evaluation:   Oxygen Discharge (Final Oxygen Re-Evaluation):   Initial Exercise Prescription: Initial Exercise Prescription - 01/08/20 0900      Date of Initial Exercise RX and Referring Provider   Date  01/08/20    Referring Provider  Bartholome Bill MD      Treadmill   MPH  2.6    Grade  1    Minutes  15    METs  3.3      Elliptical   Level  1    Speed  4.2    Minutes  15    METs  3      REL-XR   Level  3    Speed  50    Minutes  15    METs  3      T5 Nustep   Level  3    SPM  80    Minutes  15    METs  3      Prescription Details   Frequency (times per week)  2    Duration  Progress to 30 minutes of continuous aerobic without signs/symptoms of physical distress      Intensity   THRR 40-80% of Max Heartrate  107-148    Ratings of Perceived Exertion  11-13    Perceived Dyspnea  0-4      Progression   Progression  Continue to progress workloads to maintain intensity without signs/symptoms of physical distress.      Resistance Training   Training Prescription  Yes    Weight  4 lb    Reps  10-15       Perform Capillary Blood Glucose checks as needed.  Exercise Prescription Changes:  Exercise Prescription Changes    Row Name 01/08/20 0900             Response to Exercise   Blood Pressure (Admit)  142/74  Blood Pressure (Exercise)  132/74        Blood Pressure (Exit)  128/74       Heart Rate (Admit)  67 bpm       Heart Rate (Exercise)  93 bpm       Heart Rate (Exit)  65 bpm       Oxygen Saturation (Admit)  99 %       Oxygen Saturation (Exercise)  98 %       Rating of Perceived Exertion (Exercise)  7       Symptoms  none, very nervours and scared       Comments  walk test results          Exercise Comments:   Exercise Goals and Review:  Exercise Goals    Row Name 01/08/20 0923             Exercise Goals   Increase Physical Activity  Yes       Intervention  Provide advice, education, support and counseling about physical activity/exercise needs.;Develop an individualized exercise prescription for aerobic and resistive training based on initial evaluation findings, risk stratification, comorbidities and participant's personal goals.       Expected Outcomes  Short Term: Attend rehab on a regular basis to increase amount of physical activity.;Long Term: Add in home exercise to make exercise part of routine and to increase amount of physical activity.;Long Term: Exercising regularly at least 3-5 days a week.       Increase Strength and Stamina  Yes       Intervention  Provide advice, education, support and counseling about physical activity/exercise needs.;Develop an individualized exercise prescription for aerobic and resistive training based on initial evaluation findings, risk stratification, comorbidities and participant's personal goals.       Expected Outcomes  Short Term: Increase workloads from initial exercise prescription for resistance, speed, and METs.;Short Term: Perform resistance training exercises routinely during rehab and add in resistance training at home;Long Term: Improve cardiorespiratory fitness, muscular endurance and strength as measured by increased METs and functional capacity (6MWT)       Able to understand and use rate of perceived exertion (RPE) scale  Yes       Intervention  Provide education and  explanation on how to use RPE scale       Expected Outcomes  Short Term: Able to use RPE daily in rehab to express subjective intensity level;Long Term:  Able to use RPE to guide intensity level when exercising independently       Able to understand and use Dyspnea scale  Yes       Intervention  Provide education and explanation on how to use Dyspnea scale       Expected Outcomes  Short Term: Able to use Dyspnea scale daily in rehab to express subjective sense of shortness of breath during exertion;Long Term: Able to use Dyspnea scale to guide intensity level when exercising independently       Knowledge and understanding of Target Heart Rate Range (THRR)  Yes       Intervention  Provide education and explanation of THRR including how the numbers were predicted and where they are located for reference       Expected Outcomes  Short Term: Able to state/look up THRR;Short Term: Able to use daily as guideline for intensity in rehab;Long Term: Able to use THRR to govern intensity when exercising independently       Able to check pulse independently  Yes  Intervention  Provide education and demonstration on how to check pulse in carotid and radial arteries.;Review the importance of being able to check your own pulse for safety during independent exercise       Expected Outcomes  Short Term: Able to explain why pulse checking is important during independent exercise;Long Term: Able to check pulse independently and accurately       Understanding of Exercise Prescription  Yes       Intervention  Provide education, explanation, and written materials on patient's individual exercise prescription       Expected Outcomes  Long Term: Able to explain home exercise prescription to exercise independently;Short Term: Able to explain program exercise prescription          Exercise Goals Re-Evaluation :   Discharge Exercise Prescription (Final Exercise Prescription Changes): Exercise Prescription Changes -  01/08/20 0900      Response to Exercise   Blood Pressure (Admit)  142/74    Blood Pressure (Exercise)  132/74    Blood Pressure (Exit)  128/74    Heart Rate (Admit)  67 bpm    Heart Rate (Exercise)  93 bpm    Heart Rate (Exit)  65 bpm    Oxygen Saturation (Admit)  99 %    Oxygen Saturation (Exercise)  98 %    Rating of Perceived Exertion (Exercise)  7    Symptoms  none, very nervours and scared    Comments  walk test results       Nutrition:  Target Goals: Understanding of nutrition guidelines, daily intake of sodium '1500mg'$ , cholesterol '200mg'$ , calories 30% from fat and 7% or less from saturated fats, daily to have 5 or more servings of fruits and vegetables.  Education: Controlling Sodium/Reading Food Labels -Group verbal and written material supporting the discussion of sodium use in heart healthy nutrition. Review and explanation with models, verbal and written materials for utilization of the food label.   Education: General Nutrition Guidelines/Fats and Fiber: -Group instruction provided by verbal, written material, models and posters to present the general guidelines for heart healthy nutrition. Gives an explanation and review of dietary fats and fiber.   Biometrics: Pre Biometrics - 01/08/20 0924      Pre Biometrics   Height  5' 9.75" (1.772 m)    Weight  168 lb 8 oz (76.4 kg)    BMI (Calculated)  24.34    Single Leg Stand  30 seconds        Nutrition Therapy Plan and Nutrition Goals:   Nutrition Assessments: Nutrition Assessments - 01/08/20 0926      MEDFICTS Scores   Pre Score  30       MEDIFICTS Score Key:          ?70 Need to make dietary changes          40-70 Heart Healthy Diet         ? 40 Therapeutic Level Cholesterol Diet  Nutrition Goals Re-Evaluation:   Nutrition Goals Discharge (Final Nutrition Goals Re-Evaluation):   Psychosocial: Target Goals: Acknowledge presence or absence of significant depression and/or stress, maximize coping  skills, provide positive support system. Participant is able to verbalize types and ability to use techniques and skills needed for reducing stress and depression.   Education: Depression - Provides group verbal and written instruction on the correlation between heart/lung disease and depressed mood, treatment options, and the stigmas associated with seeking treatment.   Education: Sleep Hygiene -Provides group verbal and written instruction about how  sleep can affect your health.  Define sleep hygiene, discuss sleep cycles and impact of sleep habits. Review good sleep hygiene tips.     Education: Stress and Anxiety: - Provides group verbal and written instruction about the health risks of elevated stress and causes of high stress.  Discuss the correlation between heart/lung disease and anxiety and treatment options. Review healthy ways to manage with stress and anxiety.    Initial Review & Psychosocial Screening: Initial Psych Review & Screening - 01/04/20 1345      Initial Review   Current issues with  Current Stress Concerns    Comments  post MI and stent      Family Dynamics   Good Support System?  Yes      Barriers   Psychosocial barriers to participate in program  There are no identifiable barriers or psychosocial needs.;The patient should benefit from training in stress management and relaxation.      Screening Interventions   Interventions  To provide support and resources with identified psychosocial needs    Expected Outcomes  Short Term goal: Utilizing psychosocial counselor, staff and physician to assist with identification of specific Stressors or current issues interfering with healing process. Setting desired goal for each stressor or current issue identified.;Long Term Goal: Stressors or current issues are controlled or eliminated.;Short Term goal: Identification and review with participant of any Quality of Life or Depression concerns found by scoring the  questionnaire.;Long Term goal: The participant improves quality of Life and PHQ9 Scores as seen by post scores and/or verbalization of changes       Quality of Life Scores:  Quality of Life - 01/08/20 0940      Quality of Life   Select  Quality of Life      Quality of Life Scores   Health/Function Pre  26.57 %    Socioeconomic Pre  30 %    Psych/Spiritual Pre  28.29 %    Family Pre  25.5 %    GLOBAL Pre  27.56 %      Scores of 19 and below usually indicate a poorer quality of life in these areas.  A difference of  2-3 points is a clinically meaningful difference.  A difference of 2-3 points in the total score of the Quality of Life Index has been associated with significant improvement in overall quality of life, self-image, physical symptoms, and general health in studies assessing change in quality of life.  PHQ-9: Recent Review Flowsheet Data    Depression screen [facility] 2/9 01/08/2020   Decreased Interest 0   Down, Depressed, Hopeless 0   PHQ - 2 Score 0   Altered sleeping 0   Tired, decreased energy 1   Change in appetite 0   Feeling bad or failure about yourself  1   Trouble concentrating 0   Moving slowly or fidgety/restless 0   Suicidal thoughts 0   PHQ-9 Score 2   Difficult doing work/chores Not difficult at all     Interpretation of Total Score  Total Score Depression Severity:  1-4 = Minimal depression, 5-9 = Mild depression, 10-14 = Moderate depression, 15-19 = Moderately severe depression, 20-27 = Severe depression   Psychosocial Evaluation and Intervention: Psychosocial Evaluation - 01/04/20 1346      Psychosocial Evaluation & Interventions   Comments  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 He  is nervous about starting exercise, but also says he knows he needs to do something.    Expected Outcomes  Short: attend [facility] for exercise and  educaiton. Long: develop positive self care habits    Continue Psychosocial Services   Follow up required by staff       Psychosocial Re-Evaluation:   Psychosocial Discharge (Final Psychosocial Re-Evaluation):   Vocational Rehabilitation: Provide vocational rehab assistance to qualifying candidates.   Vocational Rehab Evaluation & Intervention: Vocational Rehab - 01/04/20 1336      Initial Vocational Rehab Evaluation & Intervention   Assessment shows need for Vocational Rehabilitation  No       Education: Education Goals: Education classes will be provided on a variety of topics geared toward better understanding of heart health and risk factor modification. Participant will state understanding/return demonstration of topics presented as noted by education test scores.  Learning Barriers/Preferences: Learning Barriers/Preferences - 01/04/20 1336      Learning Barriers/Preferences   Learning Barriers  None    Learning Preferences  None       General Cardiac Education Topics:  AED/CPR: - Group verbal and written instruction with the use of models to demonstrate the basic use of the AED with the basic ABC's of resuscitation.   Anatomy & Physiology of the Heart: - Group verbal and written instruction and models provide basic cardiac anatomy and physiology, with the coronary electrical and arterial systems. Review of Valvular disease and Heart Failure   Cardiac Procedures: - Group verbal and written instruction to review commonly prescribed medications for heart disease. Reviews the medication, class of the drug, and side effects. Includes the steps to properly store meds and maintain the prescription regimen. (beta blockers and nitrates)   Cardiac Medications I: - Group verbal and written instruction to review commonly prescribed medications for heart disease. Reviews the medication, class of the drug, and side effects. Includes the steps to properly store meds and  maintain the prescription regimen.   Cardiac Medications II: -Group verbal and written instruction to review commonly prescribed medications for heart disease. Reviews the medication, class of the drug, and side effects. (all other drug classes)    Go Sex-Intimacy & Heart Disease, Get SMART - Goal Setting: - Group verbal and written instruction through game format to discuss heart disease and the return to sexual intimacy. Provides group verbal and written material to discuss and apply goal setting through the application of the S.M.A.R.T. Method.   Other Matters of the Heart: - Provides group verbal, written materials and models to describe Stable Angina and Peripheral Artery. Includes description of the disease process and treatment options available to the cardiac patient.   Infection Prevention: - Provides verbal and written material to individual with discussion of infection control including proper hand washing and proper equipment cleaning during exercise session.   Cardiac Rehab from 01/08/2020 in [facility] Cardiac and Pulmonary Rehab  Date  01/08/20  Educator  [facility]  Instruction Review Code  1- Verbalizes Understanding      Falls Prevention: - Provides verbal and written material to individual with discussion of falls prevention and safety.   Cardiac Rehab from 01/08/2020 in [facility] Cardiac and Pulmonary Rehab  Date  01/08/20  Educator  [facility]  Instruction Review Code  1- Verbalizes Understanding      Other: -Provides group and verbal instruction on various topics (see comments)   Knowledge Questionnaire Score: Knowledge Questionnaire Score - 01/08/20 0925      Knowledge Questionnaire Score  Pre Score  25/26 Angina question only       Core Components/Risk Factors/Patient Goals at Admission: Personal Goals and Risk Factors at Admission - 01/08/20 0924      Core Components/Risk Factors/Patient Goals on Admission    Weight Management  Yes;Weight Maintenance    Intervention   Weight Management: Develop a combined nutrition and exercise program designed to reach desired caloric intake, while maintaining appropriate intake of nutrient and fiber, sodium and fats, and appropriate energy expenditure required for the weight goal.;Weight Management: Provide education and appropriate resources to help participant work on and attain dietary goals.    Admit Weight  168 lb 8 oz (76.4 kg)    Goal Weight: Short Term  168 lb (76.2 kg)    Goal Weight: Long Term  168 lb (76.2 kg)    Expected Outcomes  Short Term: Continue to assess and modify interventions until short term weight is achieved;Long Term: Adherence to nutrition and physical activity/exercise program aimed toward attainment of established weight goal;Weight Maintenance: Understanding of the daily nutrition guidelines, which includes 25-35% calories from fat, 7% or less cal from saturated fats, less than '200mg'$  cholesterol, less than 1.5gm of sodium, & 5 or more servings of fruits and vegetables daily    Diabetes  Yes    Intervention  Provide education about signs/symptoms and action to take for hypo/hyperglycemia.;Provide education about proper nutrition, including hydration, and aerobic/resistive exercise prescription along with prescribed medications to achieve blood glucose in normal ranges: Fasting glucose 65-99 mg/dL    Expected Outcomes  Short Term: Participant verbalizes understanding of the signs/symptoms and immediate care of hyper/hypoglycemia, proper foot care and importance of medication, aerobic/resistive exercise and nutrition plan for blood glucose control.;Long Term: Attainment of HbA1C < 7%.    Lipids  Yes    Intervention  Provide education and support for participant on nutrition & aerobic/resistive exercise along with prescribed medications to achieve LDL '70mg'$ , HDL >'40mg'$ .    Expected Outcomes  Short Term: Participant states understanding of desired cholesterol values and is compliant with medications prescribed.  Participant is following exercise prescription and nutrition guidelines.;Long Term: Cholesterol controlled with medications as prescribed, with individualized exercise RX and with personalized nutrition plan. Value goals: LDL < '70mg'$ , HDL > 40 mg.       Education:Diabetes - Individual verbal and written instruction to review signs/symptoms of diabetes, desired ranges of glucose level fasting, after meals and with exercise. Acknowledge that pre and post exercise glucose checks will be done for 3 sessions at entry of program.   Cardiac Rehab from 01/08/2020 in [facility] Cardiac and Pulmonary Rehab  Date  01/04/20  Educator  [facility]  Instruction Review Code  1- [employer] Understanding      Education: Know Your Numbers and Risk Factors: -Group verbal and written instruction about important numbers in your health.  Discussion of what are risk factors and how they play a role in the disease process.  Review of Cholesterol, Blood Pressure, Diabetes, and BMI and the role they play in your overall health.   Core Components/Risk Factors/Patient Goals Review:    Core Components/Risk Factors/Patient Goals at Discharge (Final Review):    ITP Comments: ITP Comments    Row Name 01/04/20 1344 01/08/20 0918         ITP Comments  Initial telephone orientation completed. Diagnosis can be found in [facility] 3/12. EP Orientation scheduled for 4/19  Completed 6MWT and gym orientation.  Initial ITP created and sent for review to Dr. Emily Filbert,  Medical Director.         Comments: Initial ITP

## 2020-01-09 ENCOUNTER — Encounter: Payer: BLUE CROSS/BLUE SHIELD | Admitting: *Deleted

## 2020-01-09 ENCOUNTER — Other Ambulatory Visit: Payer: Self-pay

## 2020-01-09 DIAGNOSIS — Z955 Presence of coronary angioplasty implant and graft: Secondary | ICD-10-CM

## 2020-01-09 DIAGNOSIS — I214 Non-ST elevation (NSTEMI) myocardial infarction: Secondary | ICD-10-CM

## 2020-01-09 LAB — GLUCOSE, CAPILLARY
Glucose-Capillary: 175 mg/dL — ABNORMAL HIGH (ref 70–99)
Glucose-Capillary: 169 mg/dL — ABNORMAL HIGH (ref 70–99)

## 2020-01-09 NOTE — Progress Notes (Signed)
Daily Session Note  Patient Details  Name: Jamie Roach MRN: [id number] Date of Birth: [birth year] Referring Provider:     Cardiac Rehab from 01/08/2020 in [facility]  Referring Provider  Bartholome Bill MD      Encounter Date: 01/09/2020  Check In: Session Check In - 01/09/20 0846      Check-In   Supervising physician immediately available to respond to emergencies  See telemetry face sheet for immediately available ER MD    Location  [facility]    Staff Present  Heath Lark, RN, BSN, Lance Sell, BA, ACSM CEP, Exercise Physiologist;Saeed Hood RCP,RRT,BSRT    Virtual Visit  No    Medication changes reported      No    Fall or balance concerns reported     No    Warm-up and Cool-down  Performed on first and last piece of equipment    Resistance Training Performed  Yes    VAD Patient?  No    PAD/SET Patient?  No      Pain Assessment   Currently in Pain?  No/denies          Social History   Tobacco Use  Smoking Status Never Smoker  Smokeless Tobacco Never Used    Goals Met:  Exercise tolerated well Personal goals reviewed No report of cardiac concerns or symptoms  Goals Unmet:  Not Applicable  Comments: First full day of exercise!  Patient was oriented to gym and equipment including functions, settings, policies, and procedures.  Patient's individual exercise prescription and treatment plan were reviewed.  All starting workloads were established based on the results of the 6 minute walk test done at initial orientation visit.  The plan for exercise progression was also introduced and progression will be customized based on patient's performance and goals. Pt able to follow exercise prescription today without complaint.  Will continue to monitor for progression.    Dr. Emily Filbert is Medical Director for [facility].

## 2020-01-10 ENCOUNTER — Encounter: Payer: Self-pay | Admitting: *Deleted

## 2020-01-10 DIAGNOSIS — Z955 Presence of coronary angioplasty implant and graft: Secondary | ICD-10-CM

## 2020-01-10 DIAGNOSIS — I214 Non-ST elevation (NSTEMI) myocardial infarction: Secondary | ICD-10-CM

## 2020-01-10 NOTE — Progress Notes (Signed)
Cardiac Individual Treatment Plan  Patient Details  Name: Jamie Roach MRN: [id number] Date of Birth: 1965/11/15 Referring Provider:     Cardiac Rehab from 01/08/2020 in [facility] Cardiac and Pulmonary Rehab  Referring Provider  Bartholome Bill MD      Initial Encounter Date:    Cardiac Rehab from 01/08/2020 in [facility] Cardiac and Pulmonary Rehab  Date  01/08/20      Visit Diagnosis: NSTEMI (non-ST elevated myocardial infarction) [facility])  Status post coronary artery stent placement  Patient's Home Medications on Admission:  Current Outpatient Medications:  .  aspirin 81 MG EC tablet, TAKE 1 TABLET(81 MG) BY MOUTH DAILY, Disp: , Rfl:  .  atorvastatin (LIPITOR) 80 MG tablet, Take 1 tablet (80 mg total) by mouth daily at 6 PM., Disp: 30 tablet, Rfl: 0 .  atorvastatin (LIPITOR) 80 MG tablet, TAKE 1 TABLET(80 MG) BY MOUTH DAILY AT 6 PM, Disp: , Rfl:  .  Cholecalciferol 25 MCG (1000 UT) capsule, Take by mouth., Disp: , Rfl:  .  Continuous Blood Gluc Receiver (FREESTYLE LIBRE 14 DAY READER) DEVI, Use 1 kit as directed E10.649, Disp: , Rfl:  .  Lancets (FREESTYLE) lancets, USE TO CHECK BLOOD SUGAR 6 TIMES A DAY, Disp: , Rfl:  .  metoprolol tartrate (LOPRESSOR) 25 MG tablet, Take 0.5 tablets (12.5 mg total) by mouth 2 (two) times daily., Disp: 30 tablet, Rfl: 0 .  Multiple Vitamin (MULTI-VITAMIN) tablet, Take by mouth., Disp: , Rfl:  .  NOVOLOG 100 UNIT/ML injection, SMARTSIG:75 Unit(s) SUB-Q Daily, Disp: , Rfl:  .  prasugrel (EFFIENT) 10 MG TABS tablet, Take by mouth., Disp: , Rfl:   Past Medical History: Past Medical History:  Diagnosis Date  . Type 1 diabetes (HCC)     Tobacco Use: Social History   Tobacco Use  Smoking Status Never Smoker  Smokeless Tobacco Never Used    Labs: Recent Review Scientist, physiological    Labs for ITP Cardiac and Pulmonary Rehab Latest Ref Rng & Units 12/01/2019   Cholestrol 0 - 200 mg/dL 193   LDLCALC 0 - 99 mg/dL 140(H)   HDL >40 mg/dL 49    Trlycerides <150 mg/dL 22   Hemoglobin A1c 4.8 - 5.6 % 6.7(H)       Exercise Target Goals: Exercise Program Goal: Individual exercise prescription set using results from initial 6 min walk test and THRR while considering  patient's activity barriers and safety.   Exercise Prescription Goal: Initial exercise prescription builds to 30-45 minutes a day of aerobic activity, 2-3 days per week.  Home exercise guidelines will be given to patient during program as part of exercise prescription that the participant will acknowledge.   Education: Aerobic Exercise & Resistance Training: - Gives group verbal and written instruction on the various components of exercise. Focuses on aerobic and resistive training programs and the benefits of this training and how to safely progress through these programs..   Education: Exercise & Equipment Safety: - Individual verbal instruction and demonstration of equipment use and safety with use of the equipment.   Cardiac Rehab from 01/08/2020 in [facility] Cardiac and Pulmonary Rehab  Date  01/08/20  Educator  [facility]  Instruction Review Code  1- Verbalizes Understanding      Education: Exercise Physiology & General Exercise Guidelines: - Group verbal and written instruction with models to review the exercise physiology of the cardiovascular system and associated critical values. Provides general exercise guidelines with specific guidelines to those with heart or lung disease.  Education: Flexibility, Balance, Mind/Body Relaxation: Provides group verbal/written instruction on the benefits of flexibility and balance training, including mind/body exercise modes such as yoga, pilates and tai chi.  Demonstration and skill practice provided.   Activity Barriers & Risk Stratification: Activity Barriers & Cardiac Risk Stratification - 01/08/20 0919      Activity Barriers & Cardiac Risk Stratification   Activity Barriers  Other (comment)    Comments  very nervous and  scared of exertion    Cardiac Risk Stratification  Moderate       6 Minute Walk: 6 Minute Walk    Row Name 01/08/20 0918         6 Minute Walk   Phase  Initial     Distance  1400 feet     Walk Time  6 minutes     # of Rest Breaks  0     MPH  2.65     METS  4.24     RPE  7     VO2 Peak  14.83     Symptoms  No     Resting HR  67 bpm     Resting BP  142/74 very nervous     Resting Oxygen Saturation   99 %     Exercise Oxygen Saturation  during 6 min walk  98 %     Max Ex. HR  93 bpm     Max Ex. BP  132/74     2 Minute Post BP  128/74        Oxygen Initial Assessment:   Oxygen Re-Evaluation:   Oxygen Discharge (Final Oxygen Re-Evaluation):   Initial Exercise Prescription: Initial Exercise Prescription - 01/08/20 0900      Date of Initial Exercise RX and Referring Provider   Date  01/08/20    Referring Provider  Bartholome Bill MD      Treadmill   MPH  2.6    Grade  1    Minutes  15    METs  3.3      Elliptical   Level  1    Speed  4.2    Minutes  15    METs  3      REL-XR   Level  3    Speed  50    Minutes  15    METs  3      T5 Nustep   Level  3    SPM  80    Minutes  15    METs  3      Prescription Details   Frequency (times per week)  2    Duration  Progress to 30 minutes of continuous aerobic without signs/symptoms of physical distress      Intensity   THRR 40-80% of Max Heartrate  107-148    Ratings of Perceived Exertion  11-13    Perceived Dyspnea  0-4      Progression   Progression  Continue to progress workloads to maintain intensity without signs/symptoms of physical distress.      Resistance Training   Training Prescription  Yes    Weight  4 lb    Reps  10-15       Perform Capillary Blood Glucose checks as needed.  Exercise Prescription Changes: Exercise Prescription Changes    Row Name 01/08/20 0900             Response to Exercise   Blood Pressure (Admit)  142/74  Blood Pressure (Exercise)  132/74        Blood Pressure (Exit)  128/74       Heart Rate (Admit)  67 bpm       Heart Rate (Exercise)  93 bpm       Heart Rate (Exit)  65 bpm       Oxygen Saturation (Admit)  99 %       Oxygen Saturation (Exercise)  98 %       Rating of Perceived Exertion (Exercise)  7       Symptoms  none, very nervours and scared       Comments  walk test results          Exercise Comments: Exercise Comments    Row Name 01/09/20 0848           Exercise Comments  First full day of exercise!  Patient was oriented to gym and equipment including functions, settings, policies, and procedures.  Patient's individual exercise prescription and treatment plan were reviewed.  All starting workloads were established based on the results of the 6 minute walk test done at initial orientation visit.  The plan for exercise progression was also introduced and progression will be customized based on patient's performance and goals.          Exercise Goals and Review: Exercise Goals    Row Name 01/08/20 0923             Exercise Goals   Increase Physical Activity  Yes       Intervention  Provide advice, education, support and counseling about physical activity/exercise needs.;Develop an individualized exercise prescription for aerobic and resistive training based on initial evaluation findings, risk stratification, comorbidities and participant's personal goals.       Expected Outcomes  Short Term: Attend rehab on a regular basis to increase amount of physical activity.;Long Term: Add in home exercise to make exercise part of routine and to increase amount of physical activity.;Long Term: Exercising regularly at least 3-5 days a week.       Increase Strength and Stamina  Yes       Intervention  Provide advice, education, support and counseling about physical activity/exercise needs.;Develop an individualized exercise prescription for aerobic and resistive training based on initial evaluation findings, risk stratification,  comorbidities and participant's personal goals.       Expected Outcomes  Short Term: Increase workloads from initial exercise prescription for resistance, speed, and METs.;Short Term: Perform resistance training exercises routinely during rehab and add in resistance training at home;Long Term: Improve cardiorespiratory fitness, muscular endurance and strength as measured by increased METs and functional capacity (6MWT)       Able to understand and use rate of perceived exertion (RPE) scale  Yes       Intervention  Provide education and explanation on how to use RPE scale       Expected Outcomes  Short Term: Able to use RPE daily in rehab to express subjective intensity level;Long Term:  Able to use RPE to guide intensity level when exercising independently       Able to understand and use Dyspnea scale  Yes       Intervention  Provide education and explanation on how to use Dyspnea scale       Expected Outcomes  Short Term: Able to use Dyspnea scale daily in rehab to express subjective sense of shortness of breath during exertion;Long Term: Able to use Dyspnea scale to guide intensity level  when exercising independently       Knowledge and understanding of Target Heart Rate Range (THRR)  Yes       Intervention  Provide education and explanation of THRR including how the numbers were predicted and where they are located for reference       Expected Outcomes  Short Term: Able to state/look up THRR;Short Term: Able to use daily as guideline for intensity in rehab;Long Term: Able to use THRR to govern intensity when exercising independently       Able to check pulse independently  Yes       Intervention  Provide education and demonstration on how to check pulse in carotid and radial arteries.;Review the importance of being able to check your own pulse for safety during independent exercise       Expected Outcomes  Short Term: Able to explain why pulse checking is important during independent exercise;Long  Term: Able to check pulse independently and accurately       Understanding of Exercise Prescription  Yes       Intervention  Provide education, explanation, and written materials on patient's individual exercise prescription       Expected Outcomes  Long Term: Able to explain home exercise prescription to exercise independently;Short Term: Able to explain program exercise prescription          Exercise Goals Re-Evaluation : Exercise Goals Re-Evaluation    Row Name 01/09/20 [phone]             Exercise Goal Re-Evaluation   Exercise Goals Review  Able to understand and use rate of perceived exertion (RPE) scale;Knowledge and understanding of Target Heart Rate Range (THRR);Understanding of Exercise Prescription       Comments  Reviewed RPE and dyspnea scales, THR and program prescription with pt today.  Pt voiced understanding and was given a copy of goals to take home.       Expected Outcomes  Short: Use RPE daily to regulate intensity. Long: Follow program prescription in THR.          Discharge Exercise Prescription (Final Exercise Prescription Changes): Exercise Prescription Changes - 01/08/20 0900      Response to Exercise   Blood Pressure (Admit)  142/74    Blood Pressure (Exercise)  132/74    Blood Pressure (Exit)  128/74    Heart Rate (Admit)  67 bpm    Heart Rate (Exercise)  93 bpm    Heart Rate (Exit)  65 bpm    Oxygen Saturation (Admit)  99 %    Oxygen Saturation (Exercise)  98 %    Rating of Perceived Exertion (Exercise)  7    Symptoms  none, very nervours and scared    Comments  walk test results       Nutrition:  Target Goals: Understanding of nutrition guidelines, daily intake of sodium <1565m, cholesterol <2070m calories 30% from fat and 7% or less from saturated fats, daily to have 5 or more servings of fruits and vegetables.  Education: Controlling Sodium/Reading Food Labels -Group verbal and written material supporting the discussion of sodium use in heart  healthy nutrition. Review and explanation with models, verbal and written materials for utilization of the food label.   Education: General Nutrition Guidelines/Fats and Fiber: -Group instruction provided by verbal, written material, models and posters to present the general guidelines for heart healthy nutrition. Gives an explanation and review of dietary fats and fiber.   Biometrics: Pre Biometrics - 01/08/20 [id number]  Pre Biometrics   Height  5' 9.75" (1.772 m)    Weight  168 lb 8 oz (76.4 kg)    BMI (Calculated)  24.34    Single Leg Stand  30 seconds        Nutrition Therapy Plan and Nutrition Goals:   Nutrition Assessments: Nutrition Assessments - 01/08/20 0926      MEDFICTS Scores   Pre Score  30       MEDIFICTS Score Key:          ?70 Need to make dietary changes          40-70 Heart Healthy Diet         ? 40 Therapeutic Level Cholesterol Diet  Nutrition Goals Re-Evaluation:   Nutrition Goals Discharge (Final Nutrition Goals Re-Evaluation):   Psychosocial: Target Goals: Acknowledge presence or absence of significant depression and/or stress, maximize coping skills, provide positive support system. Participant is able to verbalize types and ability to use techniques and skills needed for reducing stress and depression.   Education: Depression - Provides group verbal and written instruction on the correlation between heart/lung disease and depressed mood, treatment options, and the stigmas associated with seeking treatment.   Education: Sleep Hygiene -Provides group verbal and written instruction about how sleep can affect your health.  Define sleep hygiene, discuss sleep cycles and impact of sleep habits. Review good sleep hygiene tips.     Education: Stress and Anxiety: - Provides group verbal and written instruction about the health risks of elevated stress and causes of high stress.  Discuss the correlation between heart/lung disease and anxiety and  treatment options. Review healthy ways to manage with stress and anxiety.    Initial Review & Psychosocial Screening: Initial Psych Review & Screening - 01/04/20 1345      Initial Review   Current issues with  Current Stress Concerns    Comments  post MI and stent      Family Dynamics   Good Support System?  Yes      Barriers   Psychosocial barriers to participate in program  There are no identifiable barriers or psychosocial needs.;The patient should benefit from training in stress management and relaxation.      Screening Interventions   Interventions  To provide support and resources with identified psychosocial needs    Expected Outcomes  Short Term goal: Utilizing psychosocial counselor, staff and physician to assist with identification of specific Stressors or current issues interfering with healing process. Setting desired goal for each stressor or current issue identified.;Long Term Goal: Stressors or current issues are controlled or eliminated.;Short Term goal: Identification and review with participant of any Quality of Life or Depression concerns found by scoring the questionnaire.;Long Term goal: The participant improves quality of Life and PHQ9 Scores as seen by post scores and/or verbalization of changes       Quality of Life Scores:  Quality of Life - 01/08/20 0940      Quality of Life   Select  Quality of Life      Quality of Life Scores   Health/Function Pre  26.57 %    Socioeconomic Pre  30 %    Psych/Spiritual Pre  28.29 %    Family Pre  25.5 %    GLOBAL Pre  27.56 %      Scores of 19 and below usually indicate a poorer quality of life in these areas.  A difference of  2-3 points is a clinically meaningful difference.  A  difference of 2-3 points in the total score of the Quality of Life Index has been associated with significant improvement in overall quality of life, self-image, physical symptoms, and general health in studies assessing change in quality of  life.  PHQ-9: Recent Review Flowsheet Data    Depression screen [facility] 2/9 01/08/2020   Decreased Interest 0   Down, Depressed, Hopeless 0   PHQ - 2 Score 0   Altered sleeping 0   Tired, decreased energy 1   Change in appetite 0   Feeling bad or failure about yourself  1   Trouble concentrating 0   Moving slowly or fidgety/restless 0   Suicidal thoughts 0   PHQ-9 Score 2   Difficult doing work/chores Not difficult at all     Interpretation of Total Score  Total Score Depression Severity:  1-4 = Minimal depression, 5-9 = Mild depression, 10-14 = Moderate depression, 15-19 = Moderately severe depression, 20-27 = Severe depression   Psychosocial Evaluation and Intervention: Psychosocial Evaluation - 01/04/20 1346      Psychosocial Evaluation & Interventions   Comments  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 He is nervous about starting exercise, but also says he knows he needs to do something.    Expected Outcomes  Short: attend [facility] for exercise and educaiton. Long: develop positive self care habits    Continue Psychosocial Services   Follow up required by staff       Psychosocial Re-Evaluation:   Psychosocial Discharge (Final Psychosocial Re-Evaluation):   Vocational Rehabilitation: Provide vocational rehab assistance to qualifying candidates.   Vocational Rehab Evaluation & Intervention: Vocational Rehab - 01/04/20 1336      Initial Vocational Rehab Evaluation & Intervention   Assessment shows need for Vocational Rehabilitation  No       Education: Education Goals: Education classes will be provided on a variety of topics geared toward better understanding of heart health and risk factor modification. Participant will state understanding/return demonstration of topics presented as noted by education test scores.  Learning  Barriers/Preferences: Learning Barriers/Preferences - 01/04/20 1336      Learning Barriers/Preferences   Learning Barriers  None    Learning Preferences  None       General Cardiac Education Topics:  AED/CPR: - Group verbal and written instruction with the use of models to demonstrate the basic use of the AED with the basic ABC's of resuscitation.   Anatomy & Physiology of the Heart: - Group verbal and written instruction and models provide basic cardiac anatomy and physiology, with the coronary electrical and arterial systems. Review of Valvular disease and Heart Failure   Cardiac Procedures: - Group verbal and written instruction to review commonly prescribed medications for heart disease. Reviews the medication, class of the drug, and side effects. Includes the steps to properly store meds and maintain the prescription regimen. (beta blockers and nitrates)   Cardiac Medications I: - Group verbal and written instruction to review commonly prescribed medications for heart disease. Reviews the medication, class of the drug, and side effects. Includes the steps to properly store meds and maintain the prescription regimen.   Cardiac Medications II: -Group verbal and written instruction to review commonly prescribed medications for heart disease. Reviews the medication, class of the drug, and side effects. (all other drug classes)    Go Sex-Intimacy & Heart Disease, Get SMART - Goal  Setting: - Group verbal and written instruction through game format to discuss heart disease and the return to sexual intimacy. Provides group verbal and written material to discuss and apply goal setting through the application of the S.M.A.R.T. Method.   Other Matters of the Heart: - Provides group verbal, written materials and models to describe Stable Angina and Peripheral Artery. Includes description of the disease process and treatment options available to the cardiac patient.   Infection  Prevention: - Provides verbal and written material to individual with discussion of infection control including proper hand washing and proper equipment cleaning during exercise session.   Cardiac Rehab from 01/08/2020 in [facility] Cardiac and Pulmonary Rehab  Date  01/08/20  Educator  [facility]  Instruction Review Code  1- Verbalizes Understanding      Falls Prevention: - Provides verbal and written material to individual with discussion of falls prevention and safety.   Cardiac Rehab from 01/08/2020 in [facility] Cardiac and Pulmonary Rehab  Date  01/08/20  Educator  [facility]  Instruction Review Code  1- Verbalizes Understanding      Other: -Provides group and verbal instruction on various topics (see comments)   Knowledge Questionnaire Score: Knowledge Questionnaire Score - 01/08/20 0925      Knowledge Questionnaire Score   Pre Score  25/26 Angina question only       Core Components/Risk Factors/Patient Goals at Admission: Personal Goals and Risk Factors at Admission - 01/08/20 0924      Core Components/Risk Factors/Patient Goals on Admission    Weight Management  Yes;Weight Maintenance    Intervention  Weight Management: Develop a combined nutrition and exercise program designed to reach desired caloric intake, while maintaining appropriate intake of nutrient and fiber, sodium and fats, and appropriate energy expenditure required for the weight goal.;Weight Management: Provide education and appropriate resources to help participant work on and attain dietary goals.    Admit Weight  168 lb 8 oz (76.4 kg)    Goal Weight: Short Term  168 lb (76.2 kg)    Goal Weight: Long Term  168 lb (76.2 kg)    Expected Outcomes  Short Term: Continue to assess and modify interventions until short term weight is achieved;Long Term: Adherence to nutrition and physical activity/exercise program aimed toward attainment of established weight goal;Weight Maintenance: Understanding of the daily nutrition guidelines, which  includes 25-35% calories from fat, 7% or less cal from saturated fats, less than 233m cholesterol, less than 1.5gm of sodium, & 5 or more servings of fruits and vegetables daily    Diabetes  Yes    Intervention  Provide education about signs/symptoms and action to take for hypo/hyperglycemia.;Provide education about proper nutrition, including hydration, and aerobic/resistive exercise prescription along with prescribed medications to achieve blood glucose in normal ranges: Fasting glucose 65-99 mg/dL    Expected Outcomes  Short Term: Participant verbalizes understanding of the signs/symptoms and immediate care of hyper/hypoglycemia, proper foot care and importance of medication, aerobic/resistive exercise and nutrition plan for blood glucose control.;Long Term: Attainment of HbA1C < 7%.    Lipids  Yes    Intervention  Provide education and support for participant on nutrition & aerobic/resistive exercise along with prescribed medications to achieve LDL <773m HDL >4052m   Expected Outcomes  Short Term: Participant states understanding of desired cholesterol values and is compliant with medications prescribed. Participant is following exercise prescription and nutrition guidelines.;Long Term: Cholesterol controlled with medications as prescribed, with individualized exercise RX and with personalized nutrition plan. Value goals: LDL <  26m, HDL > 40 mg.       Education:Diabetes - Individual verbal and written instruction to review signs/symptoms of diabetes, desired ranges of glucose level fasting, after meals and with exercise. Acknowledge that pre and post exercise glucose checks will be done for 3 sessions at entry of program.   [facility]  Date  01/04/20  [facility]  [facility] Instruction Review Code  1- VUnited States Steel CorporationUnderstanding      Education: Know Your Numbers and Risk Factors: -Group verbal and written instruction about important numbers in  your health.  Discussion of what are risk factors and how they play a role in the disease process.  Review of Cholesterol, Blood Pressure, Diabetes, and BMI and the role they play in your overall health.   Core Components/Risk Factors/Patient Goals Review:    Core Components/Risk Factors/Patient Goals at Discharge (Final Review):    ITP Comments: ITP Comments    Row Name 01/04/20 1344 01/08/20 0918 01/09/20 0847 01/10/20 0534     ITP Comments  Initial telephone orientation completed. [facility] EP Orientation scheduled for 4/19  Completed 6MWT and gym orientation.  Initial ITP created and sent for review to Dr. MEmily Filbert Medical Director.  First full day of exercise!  Patient was oriented to gym and equipment including functions, settings, policies, and procedures.  Patient's individual exercise prescription and treatment plan were reviewed.  All starting workloads were established based on the results of the 6 minute walk test done at initial orientation visit.  The plan for exercise progression was also introduced and progression will be customized based on patient's performance and goals.  30 Day review completed. Medical Director review done, changes made as directed,and approval shown by signature of [employer] researcher New to program       Comments:

## 2020-01-11 ENCOUNTER — Other Ambulatory Visit: Payer: Self-pay

## 2020-01-11 ENCOUNTER — Encounter: Payer: BLUE CROSS/BLUE SHIELD | Admitting: *Deleted

## 2020-01-11 DIAGNOSIS — I214 Non-ST elevation (NSTEMI) myocardial infarction: Secondary | ICD-10-CM

## 2020-01-11 DIAGNOSIS — Z955 Presence of coronary angioplasty implant and graft: Secondary | ICD-10-CM

## 2020-01-11 LAB — GLUCOSE, CAPILLARY
Glucose-Capillary: 117 mg/dL — ABNORMAL HIGH (ref 70–99)
Glucose-Capillary: 88 mg/dL (ref 70–99)

## 2020-01-11 NOTE — Progress Notes (Signed)
Daily Session Note  Patient Details  Name: Jamie Roach MRN: [id number] Date of Birth: August 01, 1966 Referring Provider:     Cardiac Rehab from 01/08/2020 in [facility] Cardiac and Pulmonary Rehab  Referring Provider  Bartholome Bill MD      Encounter Date: 01/11/2020  Check In: Session Check In - 01/11/20 0756      Check-In   Supervising physician immediately available to respond to emergencies  See telemetry face sheet for immediately available ER MD    Location  [facility]-Cardiac & Pulmonary Rehab    Staff Present  Heath Lark, RN, BSN, CCRP;Amanda Sommer, BA, ACSM CEP, Exercise Physiologist;Melissa Caiola RDN, LDN    Virtual Visit  No    Medication changes reported      No    Fall or balance concerns reported     No    Warm-up and Cool-down  Performed on first and last piece of equipment    Resistance Training Performed  Yes    VAD Patient?  No    PAD/SET Patient?  No      Pain Assessment   Currently in Pain?  No/denies          Social History   Tobacco Use  Smoking Status Never Smoker  Smokeless Tobacco Never Used    Goals Met:  Exercise tolerated well No report of cardiac concerns or symptoms  Goals Unmet:  Not Applicable  Comments: Pt able to follow exercise prescription today without complaint.  Will continue to monitor for progression.    Dr. Emily Filbert is Medical Director for [place] and [facility].

## 2020-01-12 ENCOUNTER — Other Ambulatory Visit: Payer: Self-pay

## 2020-01-12 DIAGNOSIS — I214 Non-ST elevation (NSTEMI) myocardial infarction: Secondary | ICD-10-CM

## 2020-01-12 NOTE — Progress Notes (Signed)
Completed Initial RD Evaluation 

## 2020-01-15 ENCOUNTER — Encounter: Payer: BLUE CROSS/BLUE SHIELD | Admitting: *Deleted

## 2020-01-15 ENCOUNTER — Other Ambulatory Visit: Payer: Self-pay

## 2020-01-15 DIAGNOSIS — I214 Non-ST elevation (NSTEMI) myocardial infarction: Secondary | ICD-10-CM | POA: Diagnosis not present

## 2020-01-15 DIAGNOSIS — Z955 Presence of coronary angioplasty implant and graft: Secondary | ICD-10-CM

## 2020-01-15 NOTE — Progress Notes (Signed)
Daily Session Note  Patient Details  Name: Jamie Roach MRN: [id number] Date of Birth: Jan 29, 1966 Referring Provider:     Cardiac Rehab from 01/08/2020 in [facility] Cardiac and Pulmonary Rehab  Referring Provider  Bartholome Bill MD      Encounter Date: 01/15/2020  Check In: Session Check In - 01/15/20 0803      Check-In   Supervising physician immediately available to respond to emergencies  See telemetry face sheet for immediately available ER MD    Location  [facility]-Cardiac & Pulmonary Rehab    Staff Present  Heath Lark, RN, BSN, Laveda Norman, BS, ACSM CEP, Exercise Physiologist;Codie Tessie Fass RCP,RRT,BSRT    Virtual Visit  No    Medication changes reported      No    Fall or balance concerns reported     No    Warm-up and Cool-down  Performed on first and last piece of equipment    Resistance Training Performed  Yes    VAD Patient?  No    PAD/SET Patient?  No      Pain Assessment   Currently in Pain?  No/denies          Social History   Tobacco Use  Smoking Status Never Smoker  Smokeless Tobacco Never Used    Goals Met:  Independence with exercise equipment Exercise tolerated well No report of cardiac concerns or symptoms  Goals Unmet:  Not Applicable  Comments: Pt able to follow exercise prescription today without complaint.  Will continue to monitor for progression.    Dr. Emily Filbert is Medical Director for [place] and [facility].

## 2020-01-16 ENCOUNTER — Encounter: Payer: BLUE CROSS/BLUE SHIELD | Admitting: *Deleted

## 2020-01-16 ENCOUNTER — Other Ambulatory Visit: Payer: Self-pay

## 2020-01-16 DIAGNOSIS — I214 Non-ST elevation (NSTEMI) myocardial infarction: Secondary | ICD-10-CM

## 2020-01-16 DIAGNOSIS — Z955 Presence of coronary angioplasty implant and graft: Secondary | ICD-10-CM

## 2020-01-16 NOTE — Progress Notes (Signed)
Daily Session Note  Patient Details  Name: Jamie Roach MRN: [id number] Date of Birth: [birth year] Referring Provider:     Cardiac Rehab from 01/08/2020 in [facility] Cardiac and Pulmonary Rehab  Referring Provider  Bartholome Bill MD      Encounter Date: 01/16/2020  Check In: Session Check In - 01/16/20 0755      Check-In   Supervising physician immediately available to respond to emergencies  See telemetry face sheet for immediately available ER MD    Location  [facility]-Cardiac & Pulmonary Rehab    Staff Present  Nada Maclachlan, BA, ACSM CEP, Exercise Physiologist;Rayshard Flavia Shipper;Heath Lark, RN, BSN, CCRP    Virtual Visit  No    Medication changes reported      No    Fall or balance concerns reported     No    Warm-up and Cool-down  Performed on first and last piece of equipment    Resistance Training Performed  Yes    VAD Patient?  No    PAD/SET Patient?  No      Pain Assessment   Currently in Pain?  No/denies          Social History   Tobacco Use  Smoking Status Never Smoker  Smokeless Tobacco Never Used    Goals Met:  Independence with exercise equipment Exercise tolerated well No report of cardiac concerns or symptoms  Goals Unmet:  Not Applicable  Comments: Pt able to follow exercise prescription today without complaint.  Will continue to monitor for progression.    Dr. Emily Filbert is Medical Director for [place] and [facility].

## 2020-01-23 ENCOUNTER — Other Ambulatory Visit: Payer: Self-pay

## 2020-01-23 ENCOUNTER — Encounter: Payer: BLUE CROSS/BLUE SHIELD | Attending: Cardiology | Admitting: *Deleted

## 2020-01-23 DIAGNOSIS — I214 Non-ST elevation (NSTEMI) myocardial infarction: Secondary | ICD-10-CM

## 2020-01-23 DIAGNOSIS — Z955 Presence of coronary angioplasty implant and graft: Secondary | ICD-10-CM | POA: Insufficient documentation

## 2020-01-23 DIAGNOSIS — I252 Old myocardial infarction: Secondary | ICD-10-CM | POA: Diagnosis present

## 2020-01-23 NOTE — Progress Notes (Signed)
Daily Session Note  Patient Details  Name: Jamie Roach MRN: [id number] Date of Birth: 1966-06-17 Referring Provider:     Cardiac Rehab from 01/08/2020 in [facility] Cardiac and Pulmonary Rehab  Referring Provider  Bartholome Bill MD      Encounter Date: 01/23/2020  Check In: Session Check In - 01/23/20 0814      Check-In   Supervising physician immediately available to respond to emergencies  See telemetry face sheet for immediately available ER MD    Location  [facility]-Cardiac & Pulmonary Rehab    Staff Present  Heath Lark, RN, BSN, CCRP;Ayden Foy [place], [place], ACSM CEP, Exercise Physiologist    Virtual Visit  No    Medication changes reported      No    Fall or balance concerns reported     No    Warm-up and Cool-down  Performed on first and last piece of equipment    Resistance Training Performed  Yes    VAD Patient?  No    PAD/SET Patient?  No      Pain Assessment   Currently in Pain?  No/denies          Social History   Tobacco Use  Smoking Status Never Smoker  Smokeless Tobacco Never Used    Goals Met:  Independence with exercise equipment Exercise tolerated well No report of cardiac concerns or symptoms  Goals Unmet:  Not Applicable  Comments: Pt able to follow exercise prescription today without complaint.  Will continue to monitor for progression.    Dr. Emily Filbert is Medical Director for [place] and [facility].

## 2020-01-25 ENCOUNTER — Encounter: Payer: BLUE CROSS/BLUE SHIELD | Admitting: *Deleted

## 2020-01-25 ENCOUNTER — Other Ambulatory Visit: Payer: Self-pay

## 2020-01-25 DIAGNOSIS — I214 Non-ST elevation (NSTEMI) myocardial infarction: Secondary | ICD-10-CM

## 2020-01-25 DIAGNOSIS — Z955 Presence of coronary angioplasty implant and graft: Secondary | ICD-10-CM

## 2020-01-25 DIAGNOSIS — I252 Old myocardial infarction: Secondary | ICD-10-CM | POA: Diagnosis not present

## 2020-01-25 NOTE — Progress Notes (Signed)
Daily Session Note  Patient Details  Name: Jamie Roach MRN: [id number] Date of Birth: [birth year] Referring Provider:     Cardiac Rehab from 01/08/2020 in [facility] Cardiac and Pulmonary Rehab  Referring Provider  Bartholome Bill MD      Encounter Date: 01/25/2020  Check In: Session Check In - 01/25/20 0754      Check-In   Supervising physician immediately available to respond to emergencies  See telemetry face sheet for immediately available ER MD    Location  [facility]-Cardiac & Pulmonary Rehab    Staff Present  Nada Maclachlan, BA, ACSM CEP, Exercise Physiologist;Ojas Coone, RN, BSN, CCRP;Melissa Caiola RDN, LDN    Virtual Visit  No    Medication changes reported      No    Fall or balance concerns reported     No    Warm-up and Cool-down  Performed on first and last piece of equipment    Resistance Training Performed  Yes    VAD Patient?  No    PAD/SET Patient?  No      Pain Assessment   Currently in Pain?  No/denies          Social History   Tobacco Use  Smoking Status Never Smoker  Smokeless Tobacco Never Used    Goals Met:  Independence with exercise equipment Exercise tolerated well No report of cardiac concerns or symptoms  Goals Unmet:  Not Applicable  Comments: Pt able to follow exercise prescription today without complaint.  Will continue to monitor for progression.    Dr. Emily Filbert is Medical Director for [place] and [facility].

## 2020-01-30 ENCOUNTER — Other Ambulatory Visit: Payer: Self-pay

## 2020-01-30 ENCOUNTER — Encounter: Payer: BLUE CROSS/BLUE SHIELD | Admitting: *Deleted

## 2020-01-30 DIAGNOSIS — I252 Old myocardial infarction: Secondary | ICD-10-CM | POA: Diagnosis not present

## 2020-01-30 DIAGNOSIS — Z955 Presence of coronary angioplasty implant and graft: Secondary | ICD-10-CM

## 2020-01-30 DIAGNOSIS — I214 Non-ST elevation (NSTEMI) myocardial infarction: Secondary | ICD-10-CM

## 2020-01-30 NOTE — Progress Notes (Signed)
Daily Session Note  Patient Details  Name: Jamie Roach MRN: [id number] Date of Birth: 03-14-1966 Referring Provider:     Cardiac Rehab from 01/08/2020 in [facility] Cardiac and Pulmonary Rehab  Referring Provider  Bartholome Bill MD      Encounter Date: 01/30/2020  Check In: Session Check In - 01/30/20 0811      Check-In   Supervising physician immediately available to respond to emergencies  See telemetry face sheet for immediately available ER MD    Location  [facility]-Cardiac & Pulmonary Rehab    Staff Present  Heath Lark, RN, BSN, CCRP;Amanda Sommer, BA, ACSM CEP, Exercise Physiologist;Onesimo Hood RCP,RRT,BSRT    Virtual Visit  No    Medication changes reported      No    Fall or balance concerns reported     No    Warm-up and Cool-down  Performed on first and last piece of equipment    Resistance Training Performed  Yes    VAD Patient?  No    PAD/SET Patient?  No      Pain Assessment   Currently in Pain?  No/denies          Social History   Tobacco Use  Smoking Status Never Smoker  Smokeless Tobacco Never Used    Goals Met:  Independence with exercise equipment Exercise tolerated well No report of cardiac concerns or symptoms  Goals Unmet:  Not Applicable  Comments: Pt able to follow exercise prescription today without complaint.  Will continue to monitor for progression.    Dr. Emily Filbert is Medical Director for [place] and [facility].

## 2020-02-01 ENCOUNTER — Other Ambulatory Visit: Payer: Self-pay

## 2020-02-01 ENCOUNTER — Encounter: Payer: BLUE CROSS/BLUE SHIELD | Admitting: *Deleted

## 2020-02-01 DIAGNOSIS — I214 Non-ST elevation (NSTEMI) myocardial infarction: Secondary | ICD-10-CM

## 2020-02-01 DIAGNOSIS — Z955 Presence of coronary angioplasty implant and graft: Secondary | ICD-10-CM

## 2020-02-01 DIAGNOSIS — I252 Old myocardial infarction: Secondary | ICD-10-CM | POA: Diagnosis not present

## 2020-02-01 NOTE — Progress Notes (Signed)
Daily Session Note  Patient Details  Name: Jamie Roach MRN: [id number] Date of Birth: 1966/08/05 Referring Provider:     Cardiac Rehab from 01/08/2020 in [facility] Cardiac and Pulmonary Rehab  Referring Provider  Bartholome Bill MD      Encounter Date: 02/01/2020  Check In: Session Check In - 02/01/20 0830      Check-In   Supervising physician immediately available to respond to emergencies  See telemetry face sheet for immediately available ER MD    Location  [facility]-Cardiac & Pulmonary Rehab    Staff Present  Heath Lark, RN, BSN, CCRP;Melissa [place] RDN, Rowe Pavy, BA, ACSM CEP, Exercise Physiologist    Virtual Visit  No    Medication changes reported      No    Fall or balance concerns reported     No    Warm-up and Cool-down  Performed on first and last piece of equipment    Resistance Training Performed  Yes    VAD Patient?  No    PAD/SET Patient?  No      Pain Assessment   Currently in Pain?  No/denies          Social History   Tobacco Use  Smoking Status Never Smoker  Smokeless Tobacco Never Used    Goals Met:  Independence with exercise equipment Exercise tolerated well No report of cardiac concerns or symptoms  Goals Unmet:  Not Applicable  Comments: Pt able to follow exercise prescription today without complaint.  Will continue to monitor for progression.    Dr. Emily Filbert is Medical Director for [place] and [facility].

## 2020-02-06 ENCOUNTER — Other Ambulatory Visit: Payer: Self-pay

## 2020-02-06 ENCOUNTER — Encounter: Payer: BLUE CROSS/BLUE SHIELD | Admitting: *Deleted

## 2020-02-06 DIAGNOSIS — I214 Non-ST elevation (NSTEMI) myocardial infarction: Secondary | ICD-10-CM

## 2020-02-06 DIAGNOSIS — Z955 Presence of coronary angioplasty implant and graft: Secondary | ICD-10-CM

## 2020-02-06 DIAGNOSIS — I252 Old myocardial infarction: Secondary | ICD-10-CM | POA: Diagnosis not present

## 2020-02-06 NOTE — Progress Notes (Signed)
Daily Session Note  Patient Details  Name: Jamie Roach MRN: [id number] Date of Birth: 1966-02-24 Referring Provider:     Cardiac Rehab from 01/08/2020 in [facility] Cardiac and Pulmonary Rehab  Referring Provider  Bartholome Bill MD      Encounter Date: 02/06/2020  Check In: Session Check In - 02/06/20 1109      Check-In   Supervising physician immediately available to respond to emergencies  See telemetry face sheet for immediately available ER MD    Location  [facility]-Cardiac & Pulmonary Rehab    Staff Present  Heath Lark, RN, BSN, CCRP;Amanda Sommer, BA, ACSM CEP, Exercise Physiologist;Kuba Hood RCP,RRT,BSRT    Virtual Visit  No    Medication changes reported      No    Fall or balance concerns reported     No    Warm-up and Cool-down  Performed on first and last piece of equipment    Resistance Training Performed  Yes    VAD Patient?  No    PAD/SET Patient?  No      Pain Assessment   Currently in Pain?  No/denies          Social History   Tobacco Use  Smoking Status Never Smoker  Smokeless Tobacco Never Used    Goals Met:  Independence with exercise equipment Exercise tolerated well No report of cardiac concerns or symptoms  Goals Unmet:  Not Applicable  Comments: Pt able to follow exercise prescription today without complaint.  Will continue to monitor for progression.    Dr. Emily Filbert is Medical Director for [place] and [facility].

## 2020-02-07 ENCOUNTER — Encounter: Payer: Self-pay | Admitting: *Deleted

## 2020-02-07 DIAGNOSIS — I214 Non-ST elevation (NSTEMI) myocardial infarction: Secondary | ICD-10-CM

## 2020-02-07 DIAGNOSIS — Z955 Presence of coronary angioplasty implant and graft: Secondary | ICD-10-CM

## 2020-02-07 NOTE — Progress Notes (Signed)
Cardiac Individual Treatment Plan  Patient Details  Name: Jamie Roach MRN: [id number] Date of Birth: 1965/11/15 Referring Provider:     Cardiac Rehab from 01/08/2020 in [facility] Cardiac and Pulmonary Rehab  Referring Provider  Bartholome Bill MD      Initial Encounter Date:    Cardiac Rehab from 01/08/2020 in [facility] Cardiac and Pulmonary Rehab  Date  01/08/20      Visit Diagnosis: NSTEMI (non-ST elevated myocardial infarction) [facility])  Status post coronary artery stent placement  Patient's Home Medications on Admission:  Current Outpatient Medications:  .  aspirin 81 MG EC tablet, TAKE 1 TABLET(81 MG) BY MOUTH DAILY, Disp: , Rfl:  .  atorvastatin (LIPITOR) 80 MG tablet, Take 1 tablet (80 mg total) by mouth daily at 6 PM., Disp: 30 tablet, Rfl: 0 .  atorvastatin (LIPITOR) 80 MG tablet, TAKE 1 TABLET(80 MG) BY MOUTH DAILY AT 6 PM, Disp: , Rfl:  .  Cholecalciferol 25 MCG (1000 UT) capsule, Take by mouth., Disp: , Rfl:  .  Continuous Blood Gluc Receiver (FREESTYLE LIBRE 14 DAY READER) DEVI, Use 1 kit as directed E10.649, Disp: , Rfl:  .  Lancets (FREESTYLE) lancets, USE TO CHECK BLOOD SUGAR 6 TIMES A DAY, Disp: , Rfl:  .  metoprolol tartrate (LOPRESSOR) 25 MG tablet, Take 0.5 tablets (12.5 mg total) by mouth 2 (two) times daily., Disp: 30 tablet, Rfl: 0 .  Multiple Vitamin (MULTI-VITAMIN) tablet, Take by mouth., Disp: , Rfl:  .  NOVOLOG 100 UNIT/ML injection, SMARTSIG:75 Unit(s) SUB-Q Daily, Disp: , Rfl:  .  prasugrel (EFFIENT) 10 MG TABS tablet, Take by mouth., Disp: , Rfl:   Past Medical History: Past Medical History:  Diagnosis Date  . Type 1 diabetes (HCC)     Tobacco Use: Social History   Tobacco Use  Smoking Status Never Smoker  Smokeless Tobacco Never Used    Labs: Recent Review Scientist, physiological    Labs for ITP Cardiac and Pulmonary Rehab Latest Ref Rng & Units 12/01/2019   Cholestrol 0 - 200 mg/dL 193   LDLCALC 0 - 99 mg/dL 140(H)   HDL >40 mg/dL 49    Trlycerides <150 mg/dL 22   Hemoglobin A1c 4.8 - 5.6 % 6.7(H)       Exercise Target Goals: Exercise Program Goal: Individual exercise prescription set using results from initial 6 min walk test and THRR while considering  patient's activity barriers and safety.   Exercise Prescription Goal: Initial exercise prescription builds to 30-45 minutes a day of aerobic activity, 2-3 days per week.  Home exercise guidelines will be given to patient during program as part of exercise prescription that the participant will acknowledge.   Education: Aerobic Exercise & Resistance Training: - Gives group verbal and written instruction on the various components of exercise. Focuses on aerobic and resistive training programs and the benefits of this training and how to safely progress through these programs..   Education: Exercise & Equipment Safety: - Individual verbal instruction and demonstration of equipment use and safety with use of the equipment.   Cardiac Rehab from 01/08/2020 in [facility] Cardiac and Pulmonary Rehab  Date  01/08/20  Educator  [facility]  Instruction Review Code  1- Verbalizes Understanding      Education: Exercise Physiology & General Exercise Guidelines: - Group verbal and written instruction with models to review the exercise physiology of the cardiovascular system and associated critical values. Provides general exercise guidelines with specific guidelines to those with heart or lung disease.  Education: Flexibility, Balance, Mind/Body Relaxation: Provides group verbal/written instruction on the benefits of flexibility and balance training, including mind/body exercise modes such as yoga, pilates and tai chi.  Demonstration and skill practice provided.   Activity Barriers & Risk Stratification: Activity Barriers & Cardiac Risk Stratification - 01/08/20 0919      Activity Barriers & Cardiac Risk Stratification   Activity Barriers  Other (comment)    Comments  very nervous and  scared of exertion    Cardiac Risk Stratification  Moderate       6 Minute Walk: 6 Minute Walk    Row Name 01/08/20 0918         6 Minute Walk   Phase  Initial     Distance  1400 feet     Walk Time  6 minutes     # of Rest Breaks  0     MPH  2.65     METS  4.24     RPE  7     VO2 Peak  14.83     Symptoms  No     Resting HR  67 bpm     Resting BP  142/74 very nervous     Resting Oxygen Saturation   99 %     Exercise Oxygen Saturation  during 6 min walk  98 %     Max Ex. HR  93 bpm     Max Ex. BP  132/74     2 Minute Post BP  128/74        Oxygen Initial Assessment:   Oxygen Re-Evaluation:   Oxygen Discharge (Final Oxygen Re-Evaluation):   Initial Exercise Prescription: Initial Exercise Prescription - 01/08/20 0900      Date of Initial Exercise RX and Referring Provider   Date  01/08/20    Referring Provider  Bartholome Bill MD      Treadmill   MPH  2.6    Grade  1    Minutes  15    METs  3.3      Elliptical   Level  1    Speed  4.2    Minutes  15    METs  3      REL-XR   Level  3    Speed  50    Minutes  15    METs  3      T5 Nustep   Level  3    SPM  80    Minutes  15    METs  3      Prescription Details   Frequency (times per week)  2    Duration  Progress to 30 minutes of continuous aerobic without signs/symptoms of physical distress      Intensity   THRR 40-80% of Max Heartrate  107-148    Ratings of Perceived Exertion  11-13    Perceived Dyspnea  0-4      Progression   Progression  Continue to progress workloads to maintain intensity without signs/symptoms of physical distress.      Resistance Training   Training Prescription  Yes    Weight  4 lb    Reps  10-15       Perform Capillary Blood Glucose checks as needed.  Exercise Prescription Changes: Exercise Prescription Changes    Row Name 01/08/20 0900 01/10/20 1100 01/15/20 1300 01/23/20 1500 02/05/20 1600     Response to Exercise   Blood Pressure (Admit)  142/74   132/60  --  124/66  114/60   Blood Pressure (Exercise)  132/74  134/62  --  138/58  154/56   Blood Pressure (Exit)  128/74  112/64  --  102/52  112/64   Heart Rate (Admit)  67 bpm  87 bpm  --  87 bpm  91 bpm   Heart Rate (Exercise)  93 bpm  108 bpm  --  103 bpm  147 bpm   Heart Rate (Exit)  65 bpm  69 bpm  --  72 bpm  99 bpm   Oxygen Saturation (Admit)  99 %  --  --  --  --   Oxygen Saturation (Exercise)  98 %  --  --  --  --   Rating of Perceived Exertion (Exercise)  7  11  --  11  14   Symptoms  none, very nervours and scared  none  --  none  none   Comments  walk test results  first full day of exercise  --  --  --   Duration  --  Continue with 30 min of aerobic exercise without signs/symptoms of physical distress.  --  Continue with 30 min of aerobic exercise without signs/symptoms of physical distress.  Continue with 30 min of aerobic exercise without signs/symptoms of physical distress.   Intensity  --  THRR unchanged  --  THRR unchanged  THRR unchanged     Progression   Progression  --  Continue to progress workloads to maintain intensity without signs/symptoms of physical distress.  --  Continue to progress workloads to maintain intensity without signs/symptoms of physical distress.  Continue to progress workloads to maintain intensity without signs/symptoms of physical distress.   Average METs  --  2.98  --  3.35  5.49     Resistance Training   Training Prescription  --  Yes  --  Yes  Yes   Weight  --  4 lb  --  4 lb  4 lb   Reps  --  10-15  --  10-15  10-15     Interval Training   Interval Training  --  No  --  No  No     Treadmill   MPH  --  2.6  --  2.6  2.6   Grade  --  1  --  1  3   Minutes  --  15  --  15  15   METs  --  3.35  --  3.36  4.07     Elliptical   Level  --  --  --  --  1   Speed  --  --  --  --  4.2   Minutes  --  --  --  --  15     REL-XR   Level  --  --  --  4  4   Speed  --  --  --  50  --   Minutes  --  --  --  15  15   METs  --  --  --  --  6.9      T5 Nustep   Level  --  3  --  --  --   Minutes  --  15  --  --  --   METs  --  2.6  --  --  --     Home Exercise Plan   Plans to continue exercise at  --  --  Home (comment) walking  Home (comment)  walking  Home (comment) walking   Frequency  --  --  Add 3 additional days to program exercise sessions.  Add 3 additional days to program exercise sessions.  Add 3 additional days to program exercise sessions.   Initial Home Exercises Provided  --  --  01/15/20  01/15/20  01/15/20      Exercise Comments: Exercise Comments    Row Name 01/09/20 0848           Exercise Comments  First full day of exercise!  Patient was oriented to gym and equipment including functions, settings, policies, and procedures.  Patient's individual exercise prescription and treatment plan were reviewed.  All starting workloads were established based on the results of the 6 minute walk test done at initial orientation visit.  The plan for exercise progression was also introduced and progression will be customized based on patient's performance and goals.          Exercise Goals and Review: Exercise Goals    Row Name 01/08/20 0923             Exercise Goals   Increase Physical Activity  Yes       Intervention  Provide advice, education, support and counseling about physical activity/exercise needs.;Develop an individualized exercise prescription for aerobic and resistive training based on initial evaluation findings, risk stratification, comorbidities and participant's personal goals.       Expected Outcomes  Short Term: Attend rehab on a regular basis to increase amount of physical activity.;Long Term: Add in home exercise to make exercise part of routine and to increase amount of physical activity.;Long Term: Exercising regularly at least 3-5 days a week.       Increase Strength and Stamina  Yes       Intervention  Provide advice, education, support and counseling about physical activity/exercise  needs.;Develop an individualized exercise prescription for aerobic and resistive training based on initial evaluation findings, risk stratification, comorbidities and participant's personal goals.       Expected Outcomes  Short Term: Increase workloads from initial exercise prescription for resistance, speed, and METs.;Short Term: Perform resistance training exercises routinely during rehab and add in resistance training at home;Long Term: Improve cardiorespiratory fitness, muscular endurance and strength as measured by increased METs and functional capacity (6MWT)       Able to understand and use rate of perceived exertion (RPE) scale  Yes       Intervention  Provide education and explanation on how to use RPE scale       Expected Outcomes  Short Term: Able to use RPE daily in rehab to express subjective intensity level;Long Term:  Able to use RPE to guide intensity level when exercising independently       Able to understand and use Dyspnea scale  Yes       Intervention  Provide education and explanation on how to use Dyspnea scale       Expected Outcomes  Short Term: Able to use Dyspnea scale daily in rehab to express subjective sense of shortness of breath during exertion;Long Term: Able to use Dyspnea scale to guide intensity level when exercising independently       Knowledge and understanding of Target Heart Rate Range (THRR)  Yes       Intervention  Provide education and explanation of THRR including how the numbers were predicted and where they are located for reference       Expected Outcomes  Short Term: Able to state/look up THRR;Short  Term: Able to use daily as guideline for intensity in rehab;Long Term: Able to use THRR to govern intensity when exercising independently       Able to check pulse independently  Yes       Intervention  Provide education and demonstration on how to check pulse in carotid and radial arteries.;Review the importance of being able to check your own pulse for safety  during independent exercise       Expected Outcomes  Short Term: Able to explain why pulse checking is important during independent exercise;Long Term: Able to check pulse independently and accurately       Understanding of Exercise Prescription  Yes       Intervention  Provide education, explanation, and written materials on patient's individual exercise prescription       Expected Outcomes  Long Term: Able to explain home exercise prescription to exercise independently;Short Term: Able to explain program exercise prescription          Exercise Goals Re-Evaluation : Exercise Goals Re-Evaluation    Row Name 01/09/20 0851 01/15/20 1305 01/23/20 0745 01/23/20 1550 02/05/20 1556     Exercise Goal Re-Evaluation   Exercise Goals Review  Able to understand and use rate of perceived exertion (RPE) scale;Knowledge and understanding of Target Heart Rate Range (THRR);Understanding of Exercise Prescription  Increase Physical Activity;Increase Strength and Stamina;Able to understand and use rate of perceived exertion (RPE) scale;Able to understand and use Dyspnea scale;Knowledge and understanding of Target Heart Rate Range (THRR);Able to check pulse independently;Understanding of Exercise Prescription  Increase Physical Activity;Increase Strength and Stamina;Understanding of Exercise Prescription  Increase Physical Activity;Increase Strength and Stamina;Able to understand and use rate of perceived exertion (RPE) scale;Able to understand and use Dyspnea scale;Knowledge and understanding of Target Heart Rate Range (THRR);Able to check pulse independently;Understanding of Exercise Prescription  Increase Physical Activity;Increase Strength and Stamina;Understanding of Exercise Prescription   Comments  Reviewed RPE and dyspnea scales, THR and program prescription with pt today.  Pt voiced understanding and was given a copy of goals to take home.  Reviewed home exercise with pt today.  Pt plans to continue to walk at  home for exercise.  Also talked about using staff videos as well for exercise.  Reviewed THR, pulse, RPE, sign and symptoms, NTG use, and when to call 911 or MD.  Also discussed weather considerations and indoor options.  Pt voiced understanding.  Joe was out the end of last week for a work trip.  He did not exercise while he was gone but plans to get back to it now that they are back.  He is starting to get back his strength and stamina and regaining condfidence too.  --  Jamie Roach is doing well in rehab.  He is now back to driving again. He even said that he liked the elliptical and enjoys coming to class!  He is up to 6.9 METs on XR.  We will continue to monitor his progress.   Expected Outcomes  Short: Use RPE daily to regulate intensity. Long: Follow program prescription in THR.  Short: Start to add in exercise at home on off days from rehab.  Long: Continue to improve stamina and confidence  Short: Continue to gain confidence in heart again  Long: Continue to improve stamina and get back to normal routine  --  Short: Continue to improve on elliptical Long: Continue to get back to normal life again.      Discharge Exercise Prescription (Final Exercise Prescription Changes): Exercise  Prescription Changes - 02/05/20 1600      Response to Exercise   Blood Pressure (Admit)  114/60    Blood Pressure (Exercise)  154/56    Blood Pressure (Exit)  112/64    Heart Rate (Admit)  91 bpm    Heart Rate (Exercise)  147 bpm    Heart Rate (Exit)  99 bpm    Rating of Perceived Exertion (Exercise)  14    Symptoms  none    Duration  Continue with 30 min of aerobic exercise without signs/symptoms of physical distress.    Intensity  THRR unchanged      Progression   Progression  Continue to progress workloads to maintain intensity without signs/symptoms of physical distress.    Average METs  5.49      Resistance Training   Training Prescription  Yes    Weight  4 lb    Reps  10-15      Interval Training    Interval Training  No      Treadmill   MPH  2.6    Grade  3    Minutes  15    METs  4.07      Elliptical   Level  1    Speed  4.2    Minutes  15      REL-XR   Level  4    Minutes  15    METs  6.9      Home Exercise Plan   Plans to continue exercise at  Home (comment)   walking   Frequency  Add 3 additional days to program exercise sessions.    Initial Home Exercises Provided  01/15/20       Nutrition:  Target Goals: Understanding of nutrition guidelines, daily intake of sodium '1500mg'$ , cholesterol '200mg'$ , calories 30% from fat and 7% or less from saturated fats, daily to have 5 or more servings of fruits and vegetables.  Education: Controlling Sodium/Reading Food Labels -Group verbal and written material supporting the discussion of sodium use in heart healthy nutrition. Review and explanation with models, verbal and written materials for utilization of the food label.   Education: General Nutrition Guidelines/Fats and Fiber: -Group instruction provided by verbal, written material, models and posters to present the general guidelines for heart healthy nutrition. Gives an explanation and review of dietary fats and fiber.   Biometrics: Pre Biometrics - 01/08/20 0924      Pre Biometrics   Height  5' 9.75" (1.772 m)    Weight  168 lb 8 oz (76.4 kg)    BMI (Calculated)  24.34    Single Leg Stand  30 seconds        Nutrition Therapy Plan and Nutrition Goals: Nutrition Therapy & Goals - 01/12/20 0951      Nutrition Therapy   Diet  Low Na, Heart Healthy    Protein (specify units)  65-70g    Fiber  30 grams    Whole Grain Foods  3 servings    Saturated Fats  12 max. grams    Fruits and Vegetables  5 servings/day    Sodium  1.5 grams      Personal Nutrition Goals   Nutrition Goal  ST: increase variety LT: feel sercure to exercise outside of rehab    Comments  Pt having smoothies BID with a meal of chicken and vegetables for dinner. Smoothies include supplements  such as vitamin D, greens supplement, other greens, walnuts, berries, and collegen protein powder as  well as pea protein. Discussed heart healthy eating, sufficient protein, variety, microbiome, satiety, honoring hunger, social aspects of food, and answered pt and pt wife questions.      Intervention Plan   Intervention  Prescribe, educate and counsel regarding individualized specific dietary modifications aiming towards targeted core components such as weight, hypertension, lipid management, diabetes, heart failure and other comorbidities.;Nutrition handout(s) given to patient.    Expected Outcomes  Short Term Goal: Understand basic principles of dietary content, such as calories, fat, sodium, cholesterol and nutrients.;Short Term Goal: A plan has been developed with personal nutrition goals set during dietitian appointment.;Long Term Goal: Adherence to prescribed nutrition plan.       Nutrition Assessments: Nutrition Assessments - 01/08/20 0926      MEDFICTS Scores   Pre Score  30       MEDIFICTS Score Key:          ?70 Need to make dietary changes          40-70 Heart Healthy Diet         ? 40 Therapeutic Level Cholesterol Diet  Nutrition Goals Re-Evaluation:   Nutrition Goals Discharge (Final Nutrition Goals Re-Evaluation):   Psychosocial: Target Goals: Acknowledge presence or absence of significant depression and/or stress, maximize coping skills, provide positive support system. Participant is able to verbalize types and ability to use techniques and skills needed for reducing stress and depression.   Education: Depression - Provides group verbal and written instruction on the correlation between heart/lung disease and depressed mood, treatment options, and the stigmas associated with seeking treatment.   Education: Sleep Hygiene -Provides group verbal and written instruction about how sleep can affect your health.  Define sleep hygiene, discuss sleep cycles and impact of  sleep habits. Review good sleep hygiene tips.     Education: Stress and Anxiety: - Provides group verbal and written instruction about the health risks of elevated stress and causes of high stress.  Discuss the correlation between heart/lung disease and anxiety and treatment options. Review healthy ways to manage with stress and anxiety.    Initial Review & Psychosocial Screening: Initial Psych Review & Screening - 01/04/20 1345      Initial Review   Current issues with  Current Stress Concerns    Comments  post MI and stent      Family Dynamics   Good Support System?  Yes      Barriers   Psychosocial barriers to participate in program  There are no identifiable barriers or psychosocial needs.;The patient should benefit from training in stress management and relaxation.      Screening Interventions   Interventions  To provide support and resources with identified psychosocial needs    Expected Outcomes  Short Term goal: Utilizing psychosocial counselor, staff and physician to assist with identification of specific Stressors or current issues interfering with healing process. Setting desired goal for each stressor or current issue identified.;Long Term Goal: Stressors or current issues are controlled or eliminated.;Short Term goal: Identification and review with participant of any Quality of Life or Depression concerns found by scoring the questionnaire.;Long Term goal: The participant improves quality of Life and PHQ9 Scores as seen by post scores and/or verbalization of changes       Quality of Life Scores:  Quality of Life - 01/08/20 0940      Quality of Life   Select  Quality of Life      Quality of Life Scores   Health/Function Pre  26.57 %  Socioeconomic Pre  30 %    Psych/Spiritual Pre  28.29 %    Family Pre  25.5 %    GLOBAL Pre  27.56 %      Scores of 19 and below usually indicate a poorer quality of life in these areas.  A difference of  2-3 points is a clinically  meaningful difference.  A difference of 2-3 points in the total score of the Quality of Life Index has been associated with significant improvement in overall quality of life, self-image, physical symptoms, and general health in studies assessing change in quality of life.  PHQ-9: Recent Review Flowsheet Data    Depression screen [facility] 2/9 01/08/2020   Decreased Interest 0   Down, Depressed, Hopeless 0   PHQ - 2 Score 0   Altered sleeping 0   Tired, decreased energy 1   Change in appetite 0   Feeling bad or failure about yourself  1   Trouble concentrating 0   Moving slowly or fidgety/restless 0   Suicidal thoughts 0   PHQ-9 Score 2   Difficult doing work/chores Not difficult at all     Interpretation of Total Score  Total Score Depression Severity:  1-4 = Minimal depression, 5-9 = Mild depression, 10-14 = Moderate depression, 15-19 = Moderately severe depression, 20-27 = Severe depression   Psychosocial Evaluation and Intervention: Psychosocial Evaluation - 01/04/20 1346      Psychosocial Evaluation & Interventions   Comments  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 He is nervous about starting exercise, but also says he knows he needs to do something.    Expected Outcomes  Short: attend [facility] for exercise and educaiton. Long: develop positive self care habits    Continue Psychosocial Services   Follow up required by staff       Psychosocial Re-Evaluation: Psychosocial Re-Evaluation    [facility] Name 01/23/20 [phone]             Psychosocial Re-Evaluation   Current issues with  Current Stress Concerns       Comments  Joe went on a work trip last week to [place] and freaked out a little getting out of car and through first leg.  After that, all was well and his confidence is starting to come back and improve. He has been sleeping well.  He is feeling better and  calmer now that he is back home again.       Expected Outcomes  Short: Get back into exercise routine for confidence build up  Long: Continue to improve mental outlook.       Interventions  Stress management education;Encouraged to attend Cardiac Rehabilitation for the exercise       Continue Psychosocial Services   Follow up required by staff          Psychosocial Discharge (Final Psychosocial Re-Evaluation): Psychosocial Re-Evaluation - 01/23/20 0748      Psychosocial Re-Evaluation   Current issues with  Current Stress Concerns    Comments  Joe went on a work trip last week to [place] and freaked out a little getting out of car and through first leg.  After that, all was well and his confidence is starting to come back and improve. He has been sleeping well.  He is feeling better and calmer now that he is back home again.    Expected Outcomes  Short: Get back into exercise routine for confidence build up  Long: Continue to improve mental outlook.    Interventions  Stress management education;Encouraged to attend Cardiac Rehabilitation for the exercise    Continue Psychosocial Services   Follow up required by staff       Vocational Rehabilitation: Provide vocational rehab assistance to qualifying candidates.   Vocational Rehab Evaluation & Intervention: Vocational Rehab - 01/04/20 1336      Initial Vocational Rehab Evaluation & Intervention   Assessment shows need for Vocational Rehabilitation  No       Education: Education Goals: Education classes will be provided on a variety of topics geared toward better understanding of heart health and risk factor modification. Participant will state understanding/return demonstration of topics presented as noted by education test scores.  Learning Barriers/Preferences: Learning Barriers/Preferences - 01/04/20 1336      Learning Barriers/Preferences   Learning Barriers  None    Learning Preferences  None       General Cardiac  Education Topics:  AED/CPR: - Group verbal and written instruction with the use of models to demonstrate the basic use of the AED with the basic ABC's of resuscitation.   Anatomy & Physiology of the Heart: - Group verbal and written instruction and models provide basic cardiac anatomy and physiology, with the coronary electrical and arterial systems. Review of Valvular disease and Heart Failure   Cardiac Procedures: - Group verbal and written instruction to review commonly prescribed medications for heart disease. Reviews the medication, class of the drug, and side effects. Includes the steps to properly store meds and maintain the prescription regimen. (beta blockers and nitrates)   Cardiac Medications I: - Group verbal and written instruction to review commonly prescribed medications for heart disease. Reviews the medication, class of the drug, and side effects. Includes the steps to properly store meds and maintain the prescription regimen.   Cardiac Medications II: -Group verbal and written instruction to review commonly prescribed medications for heart disease. Reviews the medication, class of the drug, and side effects. (all other drug classes)    Go Sex-Intimacy & Heart Disease, Get SMART - Goal Setting: - Group verbal and written instruction through game format to discuss heart disease and the return to sexual intimacy. Provides group verbal and written material to discuss and apply goal setting through the application of the S.M.A.R.T. Method.   Other Matters of the Heart: - Provides group verbal, written materials and models to describe Stable Angina and Peripheral Artery. Includes description of the disease process and treatment options available to the cardiac patient.   Infection Prevention: - Provides verbal and written material to individual with discussion of infection control including proper hand washing and proper equipment cleaning during exercise session.    Cardiac Rehab from 01/08/2020 in [facility] Cardiac and Pulmonary Rehab  Date  01/08/20  Educator  [facility]  Instruction Review Code  1- Verbalizes Understanding      Falls Prevention: - Provides verbal and written material to individual with discussion of falls prevention and safety.   Cardiac Rehab from 01/08/2020 in [facility] Cardiac and Pulmonary Rehab  Date  01/08/20  Educator  [facility]  Instruction Review Code  1- Verbalizes Understanding      Other: -Provides group and verbal instruction on various topics (see comments)   Knowledge Questionnaire Score: Knowledge Questionnaire Score - 01/08/20 0925      Knowledge Questionnaire Score   Pre Score  25/26 Angina question only  Core Components/Risk Factors/Patient Goals at Admission: Personal Goals and Risk Factors at Admission - 01/08/20 0924      Core Components/Risk Factors/Patient Goals on Admission    Weight Management  Yes;Weight Maintenance    Intervention  Weight Management: Develop a combined nutrition and exercise program designed to reach desired caloric intake, while maintaining appropriate intake of nutrient and fiber, sodium and fats, and appropriate energy expenditure required for the weight goal.;Weight Management: Provide education and appropriate resources to help participant work on and attain dietary goals.    Admit Weight  168 lb 8 oz (76.4 kg)    Goal Weight: Short Term  168 lb (76.2 kg)    Goal Weight: Long Term  168 lb (76.2 kg)    Expected Outcomes  Short Term: Continue to assess and modify interventions until short term weight is achieved;Long Term: Adherence to nutrition and physical activity/exercise program aimed toward attainment of established weight goal;Weight Maintenance: Understanding of the daily nutrition guidelines, which includes 25-35% calories from fat, 7% or less cal from saturated fats, less than '200mg'$  cholesterol, less than 1.5gm of sodium, & 5 or more servings of fruits and vegetables daily    Diabetes   Yes    Intervention  Provide education about signs/symptoms and action to take for hypo/hyperglycemia.;Provide education about proper nutrition, including hydration, and aerobic/resistive exercise prescription along with prescribed medications to achieve blood glucose in normal ranges: Fasting glucose 65-99 mg/dL    Expected Outcomes  Short Term: Participant verbalizes understanding of the signs/symptoms and immediate care of hyper/hypoglycemia, proper foot care and importance of medication, aerobic/resistive exercise and nutrition plan for blood glucose control.;Long Term: Attainment of HbA1C < 7%.    Lipids  Yes    Intervention  Provide education and support for participant on nutrition & aerobic/resistive exercise along with prescribed medications to achieve LDL '70mg'$ , HDL >'40mg'$ .    Expected Outcomes  Short Term: Participant states understanding of desired cholesterol values and is compliant with medications prescribed. Participant is following exercise prescription and nutrition guidelines.;Long Term: Cholesterol controlled with medications as prescribed, with individualized exercise RX and with personalized nutrition plan. Value goals: LDL < '70mg'$ , HDL > 40 mg.       Education:Diabetes - Individual verbal and written instruction to review signs/symptoms of diabetes, desired ranges of glucose level fasting, after meals and with exercise. Acknowledge that pre and post exercise glucose checks will be done for 3 sessions at entry of program.   Cardiac Rehab from 01/08/2020 in [facility] Cardiac and Pulmonary Rehab  Date  01/04/20  Educator  [facility]  Instruction Review Code  1- [employer] Understanding      Education: Know Your Numbers and Risk Factors: -Group verbal and written instruction about important numbers in your health.  Discussion of what are risk factors and how they play a role in the disease process.  Review of Cholesterol, Blood Pressure, Diabetes, and BMI and the role they play in your overall  health.   Core Components/Risk Factors/Patient Goals Review:  Goals and Risk Factor Review    Row Name 01/23/20 0751             Core Components/Risk Factors/Patient Goals Review   Personal Goals Review  Weight Management/Obesity;Hypertension;Diabetes       Review  Jamie Roach is doing well in rehab. He has adjusted his eating habits some and his wait is down some.  Blood sugars have been steady for most part.  Blood pressures have been good in class and he will  get back to it.       Expected Outcomes  Short: Get into habit of checking pressure at home  Long: Continue to monitor risk factors.          Core Components/Risk Factors/Patient Goals at Discharge (Final Review):  Goals and Risk Factor Review - 01/23/20 0751      Core Components/Risk Factors/Patient Goals Review   Personal Goals Review  Weight Management/Obesity;Hypertension;Diabetes    Review  Jamie Roach is doing well in rehab. He has adjusted his eating habits some and his wait is down some.  Blood sugars have been steady for most part.  Blood pressures have been good in class and he will get back to it.    Expected Outcomes  Short: Get into habit of checking pressure at home  Long: Continue to monitor risk factors.       ITP Comments: ITP Comments    Row Name 01/04/20 1344 01/08/20 0918 01/09/20 0847 01/10/20 0534 01/12/20 0958   ITP Comments  Initial telephone orientation completed. Diagnosis can be found in [facility] 3/12. EP Orientation scheduled for 4/19  Completed 6MWT and gym orientation.  Initial ITP created and sent for review to Dr. Emily Filbert, Medical Director.  First full day of exercise!  Patient was oriented to gym and equipment including functions, settings, policies, and procedures.  Patient's individual exercise prescription and treatment plan were reviewed.  All starting workloads were established based on the results of the 6 minute walk test done at initial orientation visit.  The plan for exercise progression was also  introduced and progression will be customized based on patient's performance and goals.  30 Day review completed. Medical Director review done, changes made as directed,and approval shown by signature of Market researcher. New to program  Completed Initial RD Eval   Row Name 02/07/20 0543           ITP Comments  30 Day review completed. ITP review done, changes made as directed,and approval shown by signature of  Scientist, research (life sciences).          Comments:

## 2020-02-08 ENCOUNTER — Other Ambulatory Visit: Payer: Self-pay

## 2020-02-08 DIAGNOSIS — I214 Non-ST elevation (NSTEMI) myocardial infarction: Secondary | ICD-10-CM

## 2020-02-08 DIAGNOSIS — Z955 Presence of coronary angioplasty implant and graft: Secondary | ICD-10-CM

## 2020-02-08 DIAGNOSIS — I252 Old myocardial infarction: Secondary | ICD-10-CM | POA: Diagnosis not present

## 2020-02-08 NOTE — Progress Notes (Signed)
Daily Session Note  Patient Details  Name: Jamie Roach MRN: [id number] Date of Birth: 05/24/1966 Referring Provider:     Cardiac Rehab from 01/08/2020 in [facility] Cardiac and Pulmonary Rehab  Referring Provider  Bartholome Bill MD      Encounter Date: 02/08/2020  Check In: Session Check In - 02/08/20 0751      Check-In   Supervising physician immediately available to respond to emergencies  See telemetry face sheet for immediately available ER MD    Location  [facility]-Cardiac & Pulmonary Rehab    Staff Present  Vida Rigger RN, BSN;Jessica Hurley, MA, RCEP, CCRP, CCET;Amanda Sommer, BA, ACSM CEP, Exercise Physiologist;Melissa Caiola RDN, LDN    Virtual Visit  No    Medication changes reported      No    Fall or balance concerns reported     No    Warm-up and Cool-down  Performed on first and last piece of equipment    Resistance Training Performed  Yes    VAD Patient?  No    PAD/SET Patient?  No      Pain Assessment   Currently in Pain?  No/denies    Multiple Pain Sites  No          Social History   Tobacco Use  Smoking Status Never Smoker  Smokeless Tobacco Never Used    Goals Met:  Proper associated with RPD/PD & O2 Sat Independence with exercise equipment Exercise tolerated well No report of cardiac concerns or symptoms Strength training completed today  Goals Unmet:  Not Applicable  Comments: Pt able to follow exercise prescription today without complaint.  Will continue to monitor for progression.   Dr. Emily Filbert is Medical Director for [place] and [facility].

## 2020-02-13 ENCOUNTER — Other Ambulatory Visit: Payer: Self-pay

## 2020-02-13 ENCOUNTER — Encounter: Payer: BLUE CROSS/BLUE SHIELD | Admitting: *Deleted

## 2020-02-13 DIAGNOSIS — I252 Old myocardial infarction: Secondary | ICD-10-CM | POA: Diagnosis not present

## 2020-02-13 DIAGNOSIS — Z955 Presence of coronary angioplasty implant and graft: Secondary | ICD-10-CM

## 2020-02-13 DIAGNOSIS — I214 Non-ST elevation (NSTEMI) myocardial infarction: Secondary | ICD-10-CM

## 2020-02-13 NOTE — Progress Notes (Signed)
Daily Session Note  Patient Details  Name: Chancelor Hardrick MRN: [id number] Date of Birth: Aug 01, 1966 Referring Provider:     Cardiac Rehab from 01/08/2020 in [facility] Cardiac and Pulmonary Rehab  Referring Provider  Bartholome Bill MD      Encounter Date: 02/13/2020  Check In: Session Check In - 02/13/20 2158      Check-In   Supervising physician immediately available to respond to emergencies  See telemetry face sheet for immediately available ER MD    Location  [facility]-Cardiac & Pulmonary Rehab    Staff Present  Heath Lark, RN, BSN, CCRP;Brandonlee Foy [place], [place], ACSM CEP, Exercise Physiologist    Virtual Visit  No    Medication changes reported      No    Fall or balance concerns reported     No    Warm-up and Cool-down  Performed on first and last piece of equipment    Resistance Training Performed  Yes    VAD Patient?  No    PAD/SET Patient?  No      Pain Assessment   Currently in Pain?  No/denies          Social History   Tobacco Use  Smoking Status Never Smoker  Smokeless Tobacco Never Used    Goals Met:  Independence with exercise equipment Exercise tolerated well No report of cardiac concerns or symptoms  Goals Unmet:  Not Applicable  Comments: Pt able to follow exercise prescription today without complaint.  Will continue to monitor for progression.    Dr. Emily Filbert is Medical Director for [place] and [facility].

## 2020-02-15 ENCOUNTER — Other Ambulatory Visit: Payer: Self-pay

## 2020-02-15 ENCOUNTER — Encounter: Payer: BLUE CROSS/BLUE SHIELD | Admitting: *Deleted

## 2020-02-15 DIAGNOSIS — Z955 Presence of coronary angioplasty implant and graft: Secondary | ICD-10-CM

## 2020-02-15 DIAGNOSIS — I214 Non-ST elevation (NSTEMI) myocardial infarction: Secondary | ICD-10-CM

## 2020-02-15 DIAGNOSIS — I252 Old myocardial infarction: Secondary | ICD-10-CM | POA: Diagnosis not present

## 2020-02-15 NOTE — Progress Notes (Signed)
Daily Session Note  Patient Details  Name: Jamie Roach MRN: [id number] Date of Birth: 27-Aug-1966 Referring Provider:     Cardiac Rehab from 01/08/2020 in [facility] Cardiac and Pulmonary Rehab  Referring Provider  Jamie Bill MD      Encounter Date: 02/15/2020  Check In: Session Check In - 02/15/20 7591      Check-In   Supervising physician immediately available to respond to emergencies  See telemetry face sheet for immediately available ER MD    Location  [facility]-Cardiac & Pulmonary Rehab    Staff Present  Jamie Lark, RN, BSN, CCRP;Jamie [place], MA, RCEP, CCRP, Jamie Roach, Jamie Roach, ACSM CEP, Exercise Physiologist;Jamie Roach RDN, LDN    Virtual Visit  No    Medication changes reported      No    Fall or balance concerns reported     No    Warm-up and Cool-down  Performed on first and last piece of equipment    Resistance Training Performed  Yes    VAD Patient?  No    PAD/SET Patient?  No      Pain Assessment   Currently in Pain?  No/denies          Social History   Tobacco Use  Smoking Status Never Smoker  Smokeless Tobacco Never Used    Goals Met:  Independence with exercise equipment Exercise tolerated well No report of cardiac concerns or symptoms  Goals Unmet:  Not Applicable  Comments: Pt able to follow exercise prescription today without complaint.  Will continue to monitor for progression.    Jamie Roach is Medical Director for [name].

## 2020-02-20 ENCOUNTER — Encounter: Payer: BLUE CROSS/BLUE SHIELD | Attending: Cardiology | Admitting: *Deleted

## 2020-02-20 ENCOUNTER — Other Ambulatory Visit: Payer: Self-pay

## 2020-02-20 DIAGNOSIS — I252 Old myocardial infarction: Secondary | ICD-10-CM | POA: Insufficient documentation

## 2020-02-20 DIAGNOSIS — Z955 Presence of coronary angioplasty implant and graft: Secondary | ICD-10-CM | POA: Diagnosis not present

## 2020-02-20 DIAGNOSIS — I214 Non-ST elevation (NSTEMI) myocardial infarction: Secondary | ICD-10-CM

## 2020-02-20 NOTE — Progress Notes (Signed)
Daily Session Note  Patient Details  Name: Jamie Roach MRN: [id number] Date of Birth: 03-28-1966 Referring Provider:     Cardiac Rehab from 01/08/2020 in [facility] Cardiac and Pulmonary Rehab  Referring Provider  Bartholome Bill MD      Encounter Date: 02/20/2020  Check In:      Social History   Tobacco Use  Smoking Status Never Smoker  Smokeless Tobacco Never Used    Goals Met:  Independence with exercise equipment Exercise tolerated well No report of cardiac concerns or symptoms  Goals Unmet:  Not Applicable  Comments: Pt able to follow exercise prescription today without complaint.  Will continue to monitor for progression.   Dr. Emily Filbert is Medical Director for [place] and [facility].

## 2020-02-21 ENCOUNTER — Encounter: Payer: BLUE CROSS/BLUE SHIELD | Admitting: *Deleted

## 2020-02-21 ENCOUNTER — Other Ambulatory Visit: Payer: Self-pay

## 2020-02-21 DIAGNOSIS — I214 Non-ST elevation (NSTEMI) myocardial infarction: Secondary | ICD-10-CM

## 2020-02-21 DIAGNOSIS — I252 Old myocardial infarction: Secondary | ICD-10-CM | POA: Diagnosis not present

## 2020-02-21 NOTE — Progress Notes (Signed)
Daily Session Note  Patient Details  Name: Jamie Roach MRN: [id number] Date of Birth: 11-Mar-1966 Referring Provider:     Cardiac Rehab from 01/08/2020 in [facility] Cardiac and Pulmonary Rehab  Referring Provider  Bartholome Bill MD      Encounter Date: 02/21/2020  Check In:      Social History   Tobacco Use  Smoking Status Never Smoker  Smokeless Tobacco Never Used    Goals Met:  Independence with exercise equipment Exercise tolerated well No report of cardiac concerns or symptoms  Goals Unmet:  Not Applicable  Comments: Pt able to follow exercise prescription today without complaint.  Will continue to monitor for progression.    Dr. Emily Filbert is Medical Director for [place] and [facility].

## 2020-02-27 ENCOUNTER — Other Ambulatory Visit: Payer: Self-pay

## 2020-02-27 ENCOUNTER — Encounter: Payer: BLUE CROSS/BLUE SHIELD | Admitting: *Deleted

## 2020-02-27 DIAGNOSIS — Z955 Presence of coronary angioplasty implant and graft: Secondary | ICD-10-CM

## 2020-02-27 DIAGNOSIS — I252 Old myocardial infarction: Secondary | ICD-10-CM | POA: Diagnosis not present

## 2020-02-27 DIAGNOSIS — I214 Non-ST elevation (NSTEMI) myocardial infarction: Secondary | ICD-10-CM

## 2020-02-27 NOTE — Progress Notes (Signed)
Daily Session Note  Patient Details  Name: Leshaun Biebel MRN: [id number] Date of Birth: 1966-07-16 Referring Provider:     Cardiac Rehab from 01/08/2020 in [facility] Cardiac and Pulmonary Rehab  Referring Provider  Bartholome Bill MD      Encounter Date: 02/27/2020  Check In: Session Check In - 02/27/20 3125      Check-In   Supervising physician immediately available to respond to emergencies  See telemetry face sheet for immediately available ER MD    Location  [facility]-Cardiac & Pulmonary Rehab    Staff Present  Heath Lark, RN, BSN, CCRP;Melissa Caiola RDN, LDN;Tyde Hood RCP,RRT,BSRT    Virtual Visit  No    Medication changes reported      No    Fall or balance concerns reported     No    Warm-up and Cool-down  Performed on first and last piece of equipment    Resistance Training Performed  Yes    VAD Patient?  No    PAD/SET Patient?  No      Pain Assessment   Currently in Pain?  No/denies          Social History   Tobacco Use  Smoking Status Never Smoker  Smokeless Tobacco Never Used    Goals Met:  Independence with exercise equipment Exercise tolerated well No report of cardiac concerns or symptoms  Goals Unmet:  Not Applicable  Comments: Pt able to follow exercise prescription today without complaint.  Will continue to monitor for progression.    Dr. Emily Filbert is Medical Director for [place] and [facility].

## 2020-02-29 ENCOUNTER — Other Ambulatory Visit: Payer: Self-pay

## 2020-02-29 ENCOUNTER — Encounter: Payer: BLUE CROSS/BLUE SHIELD | Admitting: *Deleted

## 2020-02-29 DIAGNOSIS — I252 Old myocardial infarction: Secondary | ICD-10-CM | POA: Diagnosis not present

## 2020-02-29 DIAGNOSIS — I214 Non-ST elevation (NSTEMI) myocardial infarction: Secondary | ICD-10-CM

## 2020-02-29 DIAGNOSIS — Z955 Presence of coronary angioplasty implant and graft: Secondary | ICD-10-CM

## 2020-02-29 NOTE — Progress Notes (Signed)
Daily Session Note  Patient Details  Name: Jamie Roach MRN: [id number] Date of Birth: [birth year] Referring Provider:     Cardiac Rehab from 01/08/2020 in [facility] Cardiac and Pulmonary Rehab  Referring Provider Bartholome Bill MD      Encounter Date: 02/29/2020  Check In:  Session Check In - 02/29/20 0858      Check-In   Supervising physician immediately available to respond to emergencies See telemetry face sheet for immediately available ER MD    Location [facility]-Cardiac & Pulmonary Rehab    Staff Present Heath Lark, RN, BSN, CCRP;Melissa Jansen RDN, LDN;Jessica [place], MA, RCEP, CCRP, CCET    Virtual Visit No    Medication changes reported     No    Fall or balance concerns reported    No    Warm-up and Cool-down Performed on first and last piece of equipment    Resistance Training Performed Yes    VAD Patient? No    PAD/SET Patient? No      Pain Assessment   Currently in Pain? No/denies              Social History   Tobacco Use  Smoking Status Never Smoker  Smokeless Tobacco Never Used    Goals Met:  Independence with exercise equipment Exercise tolerated well No report of cardiac concerns or symptoms  Goals Unmet:  Not Applicable  Comments: Pt able to follow exercise prescription today without complaint.  Will continue to monitor for progression.    Dr. Emily Filbert is Medical Director for [place] and [facility].

## 2020-03-05 ENCOUNTER — Encounter: Payer: BLUE CROSS/BLUE SHIELD | Admitting: *Deleted

## 2020-03-05 ENCOUNTER — Other Ambulatory Visit: Payer: Self-pay

## 2020-03-05 DIAGNOSIS — I214 Non-ST elevation (NSTEMI) myocardial infarction: Secondary | ICD-10-CM

## 2020-03-05 DIAGNOSIS — I252 Old myocardial infarction: Secondary | ICD-10-CM | POA: Diagnosis not present

## 2020-03-05 DIAGNOSIS — Z955 Presence of coronary angioplasty implant and graft: Secondary | ICD-10-CM

## 2020-03-05 NOTE — Progress Notes (Signed)
Daily Session Note  Patient Details  Name: Jamie Roach MRN: [id number] Date of Birth: 1966-03-17 Referring Provider:     Cardiac Rehab from 01/08/2020 in [facility] Cardiac and Pulmonary Rehab  Referring Provider Bartholome Bill MD      Encounter Date: 03/05/2020  Check In:  Session Check In - 03/05/20 0945      Check-In   Supervising physician immediately available to respond to emergencies See telemetry face sheet for immediately available ER MD    Location [facility]-Cardiac & Pulmonary Rehab    Staff Present Heath Lark, RN, BSN, Lance Sell, BA, ACSM CEP, Exercise Physiologist;Asir Hood RCP,RRT,BSRT    Virtual Visit No    Medication changes reported     No    Fall or balance concerns reported    No    Warm-up and Cool-down Performed on first and last piece of equipment    Resistance Training Performed Yes    VAD Patient? No    PAD/SET Patient? No      Pain Assessment   Currently in Pain? No/denies              Social History   Tobacco Use  Smoking Status Never Smoker  Smokeless Tobacco Never Used    Goals Met:  Independence with exercise equipment Exercise tolerated well No report of cardiac concerns or symptoms  Goals Unmet:  Not Applicable  Comments: Pt able to follow exercise prescription today without complaint.  Will continue to monitor for progression.    Dr. Emily Filbert is Medical Director for [place] and [facility].

## 2020-03-06 ENCOUNTER — Encounter: Payer: Self-pay | Admitting: *Deleted

## 2020-03-06 DIAGNOSIS — I214 Non-ST elevation (NSTEMI) myocardial infarction: Secondary | ICD-10-CM

## 2020-03-06 DIAGNOSIS — Z955 Presence of coronary angioplasty implant and graft: Secondary | ICD-10-CM

## 2020-03-06 NOTE — Progress Notes (Signed)
Cardiac Individual Treatment Plan  Patient Details  Name: Jamie Roach MRN: [id number] Date of Birth: 1966-03-06 Referring Provider:     Cardiac Rehab from 01/08/2020 in [facility] Cardiac and Pulmonary Rehab  Referring Provider Bartholome Bill MD      Initial Encounter Date:    Cardiac Rehab from 01/08/2020 in [facility] Cardiac and Pulmonary Rehab  Date 01/08/20      Visit Diagnosis: NSTEMI (non-ST elevated myocardial infarction) [facility])  Status post coronary artery stent placement  Patient's Home Medications on Admission:  Current Outpatient Medications:  .  aspirin 81 MG EC tablet, TAKE 1 TABLET(81 MG) BY MOUTH DAILY, Disp: , Rfl:  .  atorvastatin (LIPITOR) 80 MG tablet, Take 1 tablet (80 mg total) by mouth daily at 6 PM., Disp: 30 tablet, Rfl: 0 .  atorvastatin (LIPITOR) 80 MG tablet, TAKE 1 TABLET(80 MG) BY MOUTH DAILY AT 6 PM, Disp: , Rfl:  .  Cholecalciferol 25 MCG (1000 UT) capsule, Take by mouth., Disp: , Rfl:  .  Continuous Blood Gluc Receiver (FREESTYLE LIBRE 14 DAY READER) DEVI, Use 1 kit as directed E10.649, Disp: , Rfl:  .  Lancets (FREESTYLE) lancets, USE TO CHECK BLOOD SUGAR 6 TIMES A DAY, Disp: , Rfl:  .  metoprolol tartrate (LOPRESSOR) 25 MG tablet, Take 0.5 tablets (12.5 mg total) by mouth 2 (two) times daily., Disp: 30 tablet, Rfl: 0 .  Multiple Vitamin (MULTI-VITAMIN) tablet, Take by mouth., Disp: , Rfl:  .  NOVOLOG 100 UNIT/ML injection, SMARTSIG:75 Unit(s) SUB-Q Daily, Disp: , Rfl:  .  prasugrel (EFFIENT) 10 MG TABS tablet, Take by mouth., Disp: , Rfl:   Past Medical History: Past Medical History:  Diagnosis Date  . Type 1 diabetes (HCC)     Tobacco Use: Social History   Tobacco Use  Smoking Status Never Smoker  Smokeless Tobacco Never Used    Labs: Recent Review Scientist, physiological    Labs for ITP Cardiac and Pulmonary Rehab Latest Ref Rng & Units 12/01/2019   Cholestrol 0 - 200 mg/dL 193   LDLCALC 0 - 99 mg/dL 140(H)   HDL >40 mg/dL 49    Trlycerides <150 mg/dL 22   Hemoglobin A1c 4.8 - 5.6 % 6.7(H)       Exercise Target Goals: Exercise Program Goal: Individual exercise prescription set using results from initial 6 min walk test and THRR while considering  patient's activity barriers and safety.   Exercise Prescription Goal: Initial exercise prescription builds to 30-45 minutes a day of aerobic activity, 2-3 days per week.  Home exercise guidelines will be given to patient during program as part of exercise prescription that the participant will acknowledge.   Education: Aerobic Exercise & Resistance Training: - Gives group verbal and written instruction on the various components of exercise. Focuses on aerobic and resistive training programs and the benefits of this training and how to safely progress through these programs..   Education: Exercise & Equipment Safety: - Individual verbal instruction and demonstration of equipment use and safety with use of the equipment.   Cardiac Rehab from 01/08/2020 in [facility] Cardiac and Pulmonary Rehab  Date 01/08/20  Educator [facility]  Instruction Review Code 1- Verbalizes Understanding      Education: Exercise Physiology & General Exercise Guidelines: - Group verbal and written instruction with models to review the exercise physiology of the cardiovascular system and associated critical values. Provides general exercise guidelines with specific guidelines to those with heart or lung disease.    Education: Flexibility, Balance,  Mind/Body Relaxation: Provides group verbal/written instruction on the benefits of flexibility and balance training, including mind/body exercise modes such as yoga, pilates and tai chi.  Demonstration and skill practice provided.   Activity Barriers & Risk Stratification:  Activity Barriers & Cardiac Risk Stratification - 01/08/20 0919      Activity Barriers & Cardiac Risk Stratification   Activity Barriers Other (comment)    Comments very nervous and  scared of exertion    Cardiac Risk Stratification Moderate           6 Minute Walk:  6 Minute Walk    Row Name 01/08/20 0918         6 Minute Walk   Phase Initial     Distance 1400 feet     Walk Time 6 minutes     # of Rest Breaks 0     MPH 2.65     METS 4.24     RPE 7     VO2 Peak 14.83     Symptoms No     Resting HR 67 bpm     Resting BP 142/74  very nervous     Resting Oxygen Saturation  99 %     Exercise Oxygen Saturation  during 6 min walk 98 %     Max Ex. HR 93 bpm     Max Ex. BP 132/74     2 Minute Post BP 128/74            Oxygen Initial Assessment:   Oxygen Re-Evaluation:   Oxygen Discharge (Final Oxygen Re-Evaluation):   Initial Exercise Prescription:  Initial Exercise Prescription - 01/08/20 0900      Date of Initial Exercise RX and Referring Provider   Date 01/08/20    Referring Provider Bartholome Bill MD      Treadmill   MPH 2.6    Grade 1    Minutes 15    METs 3.3      Elliptical   Level 1    Speed 4.2    Minutes 15    METs 3      REL-XR   Level 3    Speed 50    Minutes 15    METs 3      T5 Nustep   Level 3    SPM 80    Minutes 15    METs 3      Prescription Details   Frequency (times per week) 2    Duration Progress to 30 minutes of continuous aerobic without signs/symptoms of physical distress      Intensity   THRR 40-80% of Max Heartrate 107-148    Ratings of Perceived Exertion 11-13    Perceived Dyspnea 0-4      Progression   Progression Continue to progress workloads to maintain intensity without signs/symptoms of physical distress.      Resistance Training   Training Prescription Yes    Weight 4 lb    Reps 10-15           Perform Capillary Blood Glucose checks as needed.  Exercise Prescription Changes:  Exercise Prescription Changes    Row Name 01/08/20 0900 01/10/20 1100 01/15/20 1300 01/23/20 1500 02/05/20 1600     Response to Exercise   Blood Pressure (Admit) 142/74 132/60 -- 124/66 114/60    Blood Pressure (Exercise) 132/74 134/62 -- 138/58 154/56   Blood Pressure (Exit) 128/74 112/64 -- 102/52 112/64   Heart Rate (Admit) 67 bpm 87 bpm -- 87 bpm  91 bpm   Heart Rate (Exercise) 93 bpm 108 bpm -- 103 bpm 147 bpm   Heart Rate (Exit) 65 bpm 69 bpm -- 72 bpm 99 bpm   Oxygen Saturation (Admit) 99 % -- -- -- --   Oxygen Saturation (Exercise) 98 % -- -- -- --   Rating of Perceived Exertion (Exercise) 7 11 -- 11 14   Symptoms none, very nervours and scared none -- none none   Comments walk test results first full day of exercise -- -- --   Duration -- Continue with 30 min of aerobic exercise without signs/symptoms of physical distress. -- Continue with 30 min of aerobic exercise without signs/symptoms of physical distress. Continue with 30 min of aerobic exercise without signs/symptoms of physical distress.   Intensity -- THRR unchanged -- THRR unchanged THRR unchanged     Progression   Progression -- Continue to progress workloads to maintain intensity without signs/symptoms of physical distress. -- Continue to progress workloads to maintain intensity without signs/symptoms of physical distress. Continue to progress workloads to maintain intensity without signs/symptoms of physical distress.   Average METs -- 2.98 -- 3.35 5.49     Resistance Training   Training Prescription -- Yes -- Yes Yes   Weight -- 4 lb -- 4 lb 4 lb   Reps -- 10-15 -- 10-15 10-15     Interval Training   Interval Training -- No -- No No     Treadmill   MPH -- 2.6 -- 2.6 2.6   Grade -- 1 -- 1 3   Minutes -- 15 -- 15 15   METs -- 3.35 -- 3.36 4.07     Elliptical   Level -- -- -- -- 1   Speed -- -- -- -- 4.2   Minutes -- -- -- -- 15     REL-XR   Level -- -- -- 4 4   Speed -- -- -- 50 --   Minutes -- -- -- 15 15   METs -- -- -- -- 6.9     T5 Nustep   Level -- 3 -- -- --   Minutes -- 15 -- -- --   METs -- 2.6 -- -- --     Home Exercise Plan   Plans to continue exercise at -- -- Home (comment)   walking Home (comment)  walking Home (comment)  walking   Frequency -- -- Add 3 additional days to program exercise sessions. Add 3 additional days to program exercise sessions. Add 3 additional days to program exercise sessions.   Initial Home Exercises Provided -- -- 01/15/20 01/15/20 01/15/20   Row Name 02/22/20 0900             Response to Exercise   Blood Pressure (Admit) 108/64       Blood Pressure (Exercise) 144/70       Blood Pressure (Exit) 124/64       Heart Rate (Admit) 106 bpm       Heart Rate (Exercise) 152 bpm       Heart Rate (Exit) 93 bpm       Rating of Perceived Exertion (Exercise) 13       Symptoms none       Duration Continue with 30 min of aerobic exercise without signs/symptoms of physical distress.       Intensity THRR unchanged         Progression   Progression Continue to progress workloads to maintain intensity without signs/symptoms of physical distress.  Average METs 6.25         Resistance Training   Training Prescription Yes       Weight 5 lb       Reps 10-15         Interval Training   Interval Training No         Treadmill   MPH 3.8       Grade 4.5       Minutes 15       METs 6.27         Elliptical   Level 4       Minutes 15              Exercise Comments:  Exercise Comments    Row Name 01/09/20 0848           Exercise Comments First full day of exercise!  Patient was oriented to gym and equipment including functions, settings, policies, and procedures.  Patient's individual exercise prescription and treatment plan were reviewed.  All starting workloads were established based on the results of the 6 minute walk test done at initial orientation visit.  The plan for exercise progression was also introduced and progression will be customized based on patient's performance and goals.              Exercise Goals and Review:  Exercise Goals    Row Name 01/08/20 0923             Exercise Goals   Increase Physical  Activity Yes       Intervention Provide advice, education, support and counseling about physical activity/exercise needs.;Develop an individualized exercise prescription for aerobic and resistive training based on initial evaluation findings, risk stratification, comorbidities and participant's personal goals.       Expected Outcomes Short Term: Attend rehab on a regular basis to increase amount of physical activity.;Long Term: Add in home exercise to make exercise part of routine and to increase amount of physical activity.;Long Term: Exercising regularly at least 3-5 days a week.       Increase Strength and Stamina Yes       Intervention Provide advice, education, support and counseling about physical activity/exercise needs.;Develop an individualized exercise prescription for aerobic and resistive training based on initial evaluation findings, risk stratification, comorbidities and participant's personal goals.       Expected Outcomes Short Term: Increase workloads from initial exercise prescription for resistance, speed, and METs.;Short Term: Perform resistance training exercises routinely during rehab and add in resistance training at home;Long Term: Improve cardiorespiratory fitness, muscular endurance and strength as measured by increased METs and functional capacity (6MWT)       Able to understand and use rate of perceived exertion (RPE) scale Yes       Intervention Provide education and explanation on how to use RPE scale       Expected Outcomes Short Term: Able to use RPE daily in rehab to express subjective intensity level;Long Term:  Able to use RPE to guide intensity level when exercising independently       Able to understand and use Dyspnea scale Yes       Intervention Provide education and explanation on how to use Dyspnea scale       Expected Outcomes Short Term: Able to use Dyspnea scale daily in rehab to express subjective sense of shortness of breath during exertion;Long Term: Able to  use Dyspnea scale to guide intensity level when exercising independently  Knowledge and understanding of Target Heart Rate Range (THRR) Yes       Intervention Provide education and explanation of THRR including how the numbers were predicted and where they are located for reference       Expected Outcomes Short Term: Able to state/look up THRR;Short Term: Able to use daily as guideline for intensity in rehab;Long Term: Able to use THRR to govern intensity when exercising independently       Able to check pulse independently Yes       Intervention Provide education and demonstration on how to check pulse in carotid and radial arteries.;Review the importance of being able to check your own pulse for safety during independent exercise       Expected Outcomes Short Term: Able to explain why pulse checking is important during independent exercise;Long Term: Able to check pulse independently and accurately       Understanding of Exercise Prescription Yes       Intervention Provide education, explanation, and written materials on patient's individual exercise prescription       Expected Outcomes Long Term: Able to explain home exercise prescription to exercise independently;Short Term: Able to explain program exercise prescription              Exercise Goals Re-Evaluation :  Exercise Goals Re-Evaluation    Row Name 01/09/20 0851 01/15/20 1305 01/23/20 0745 01/23/20 1550 02/05/20 1556     Exercise Goal Re-Evaluation   Exercise Goals Review Able to understand and use rate of perceived exertion (RPE) scale;Knowledge and understanding of Target Heart Rate Range (THRR);Understanding of Exercise Prescription Increase Physical Activity;Increase Strength and Stamina;Able to understand and use rate of perceived exertion (RPE) scale;Able to understand and use Dyspnea scale;Knowledge and understanding of Target Heart Rate Range (THRR);Able to check pulse independently;Understanding of Exercise Prescription  Increase Physical Activity;Increase Strength and Stamina;Understanding of Exercise Prescription Increase Physical Activity;Increase Strength and Stamina;Able to understand and use rate of perceived exertion (RPE) scale;Able to understand and use Dyspnea scale;Knowledge and understanding of Target Heart Rate Range (THRR);Able to check pulse independently;Understanding of Exercise Prescription Increase Physical Activity;Increase Strength and Stamina;Understanding of Exercise Prescription   Comments Reviewed RPE and dyspnea scales, THR and program prescription with pt today.  Pt voiced understanding and was given a copy of goals to take home. Reviewed home exercise with pt today.  Pt plans to continue to walk at home for exercise.  Also talked about using staff videos as well for exercise.  Reviewed THR, pulse, RPE, sign and symptoms, NTG use, and when to call 911 or MD.  Also discussed weather considerations and indoor options.  Pt voiced understanding. Joe was out the end of last week for a work trip.  He did not exercise while he was gone but plans to get back to it now that they are back.  He is starting to get back his strength and stamina and regaining condfidence too. -- Wille Glaser is doing well in rehab.  He is now back to driving again. He even said that he liked the elliptical and enjoys coming to class!  He is up to 6.9 METs on XR.  We will continue to monitor his progress.   Expected Outcomes Short: Use RPE daily to regulate intensity. Long: Follow program prescription in THR. Short: Start to add in exercise at home on off days from rehab.  Long: Continue to improve stamina and confidence Short: Continue to gain confidence in heart again  Long: Continue to improve stamina and  get back to normal routine -- Short: Continue to improve on elliptical Long: Continue to get back to normal life again.   [place] Name 02/15/20 3149 02/22/20 0915           Exercise Goal Re-Evaluation   Exercise Goals Review Increase  Physical Activity;Increase Strength and Stamina;Understanding of Exercise Prescription Increase Physical Activity;Increase Strength and Stamina;Understanding of Exercise Prescription      Comments Wille Glaser is doing well in rehab.  He walks at work but not much else.  He has a bike that he is going to start to use.  He has been doing a lot of yard work.  His stamina is getting better but he is still very tired in the evening and has some flutters.  We talked about talking to the doc about it. Joe has made great progress and increased workloads and overall MET level.  He has also moved up on weights strength training.      Expected Outcomes Short: Talk to doc abotu flutters and add in more exercise  Long: Continue to build stamina back up. Short: continue to attend consistently Long:  continue to progress workloads             Discharge Exercise Prescription (Final Exercise Prescription Changes):  Exercise Prescription Changes - 02/22/20 0900      Response to Exercise   Blood Pressure (Admit) 108/64    Blood Pressure (Exercise) 144/70    Blood Pressure (Exit) 124/64    Heart Rate (Admit) 106 bpm    Heart Rate (Exercise) 152 bpm    Heart Rate (Exit) 93 bpm    Rating of Perceived Exertion (Exercise) 13    Symptoms none    Duration Continue with 30 min of aerobic exercise without signs/symptoms of physical distress.    Intensity THRR unchanged      Progression   Progression Continue to progress workloads to maintain intensity without signs/symptoms of physical distress.    Average METs 6.25      Resistance Training   Training Prescription Yes    Weight 5 lb    Reps 10-15      Interval Training   Interval Training No      Treadmill   MPH 3.8    Grade 4.5    Minutes 15    METs 6.27      Elliptical   Level 4    Minutes 15           Nutrition:  Target Goals: Understanding of nutrition guidelines, daily intake of sodium <1559m, cholesterol <2042m calories 30% from fat and 7% or  less from saturated fats, daily to have 5 or more servings of fruits and vegetables.  Education: Controlling Sodium/Reading Food Labels -Group verbal and written material supporting the discussion of sodium use in heart healthy nutrition. Review and explanation with models, verbal and written materials for utilization of the food label.   Education: General Nutrition Guidelines/Fats and Fiber: -Group instruction provided by verbal, written material, models and posters to present the general guidelines for heart healthy nutrition. Gives an explanation and review of dietary fats and fiber.   Biometrics:  Pre Biometrics - 01/08/20 0924      Pre Biometrics   Height 5' 9.75" (1.772 m)    Weight 168 lb 8 oz (76.4 kg)    BMI (Calculated) 24.34    Single Leg Stand 30 seconds            Nutrition Therapy Plan and Nutrition Goals:  Nutrition Therapy & Goals - 01/12/20 0951      Nutrition Therapy   Diet Low Na, Heart Healthy    Protein (specify units) 65-70g    Fiber 30 grams    Whole Grain Foods 3 servings    Saturated Fats 12 max. grams    Fruits and Vegetables 5 servings/day    Sodium 1.5 grams      Personal Nutrition Goals   Nutrition Goal ST: increase variety LT: feel sercure to exercise outside of rehab    Comments Pt having smoothies BID with a meal of chicken and vegetables for dinner. Smoothies include supplements such as vitamin D, greens supplement, other greens, walnuts, berries, and collegen protein powder as well as pea protein. Discussed heart healthy eating, sufficient protein, variety, microbiome, satiety, honoring hunger, social aspects of food, and answered pt and pt wife questions.      Intervention Plan   Intervention Prescribe, educate and counsel regarding individualized specific dietary modifications aiming towards targeted core components such as weight, hypertension, lipid management, diabetes, heart failure and other comorbidities.;Nutrition handout(s) given  to patient.    Expected Outcomes Short Term Goal: Understand basic principles of dietary content, such as calories, fat, sodium, cholesterol and nutrients.;Short Term Goal: A plan has been developed with personal nutrition goals set during dietitian appointment.;Long Term Goal: Adherence to prescribed nutrition plan.           Nutrition Assessments:  Nutrition Assessments - 01/08/20 0926      MEDFICTS Scores   Pre Score 30           MEDIFICTS Score Key:          ?70 Need to make dietary changes          40-70 Heart Healthy Diet         ? 40 Therapeutic Level Cholesterol Diet  Nutrition Goals Re-Evaluation:   Nutrition Goals Discharge (Final Nutrition Goals Re-Evaluation):   Psychosocial: Target Goals: Acknowledge presence or absence of significant depression and/or stress, maximize coping skills, provide positive support system. Participant is able to verbalize types and ability to use techniques and skills needed for reducing stress and depression.   Education: Depression - Provides group verbal and written instruction on the correlation between heart/lung disease and depressed mood, treatment options, and the stigmas associated with seeking treatment.   Education: Sleep Hygiene -Provides group verbal and written instruction about how sleep can affect your health.  Define sleep hygiene, discuss sleep cycles and impact of sleep habits. Review good sleep hygiene tips.     Education: Stress and Anxiety: - Provides group verbal and written instruction about the health risks of elevated stress and causes of high stress.  Discuss the correlation between heart/lung disease and anxiety and treatment options. Review healthy ways to manage with stress and anxiety.    Initial Review & Psychosocial Screening:  Initial Psych Review & Screening - 01/04/20 1345      Initial Review   Current issues with Current Stress Concerns    Comments post MI and stent      Family Dynamics    Good Support System? Yes      Barriers   Psychosocial barriers to participate in program There are no identifiable barriers or psychosocial needs.;The patient should benefit from training in stress management and relaxation.      Screening Interventions   Interventions To provide support and resources with identified psychosocial needs    Expected Outcomes Short Term goal: Utilizing psychosocial counselor, staff  and physician to assist with identification of specific Stressors or current issues interfering with healing process. Setting desired goal for each stressor or current issue identified.;Long Term Goal: Stressors or current issues are controlled or eliminated.;Short Term goal: Identification and review with participant of any Quality of Life or Depression concerns found by scoring the questionnaire.;Long Term goal: The participant improves quality of Life and PHQ9 Scores as seen by post scores and/or verbalization of changes           Quality of Life Scores:   Quality of Life - 01/08/20 0940      Quality of Life   Select Quality of Life      Quality of Life Scores   Health/Function Pre 26.57 %    Socioeconomic Pre 30 %    Psych/Spiritual Pre 28.29 %    Family Pre 25.5 %    GLOBAL Pre 27.56 %          Scores of 19 and below usually indicate a poorer quality of life in these areas.  A difference of  2-3 points is a clinically meaningful difference.  A difference of 2-3 points in the total score of the Quality of Life Index has been associated with significant improvement in overall quality of life, self-image, physical symptoms, and general health in studies assessing change in quality of life.  PHQ-9: Recent Review Flowsheet Data    Depression screen [facility] 2/9 01/08/2020   Decreased Interest 0   Down, Depressed, Hopeless 0   PHQ - 2 Score 0   Altered sleeping 0   Tired, decreased energy 1   Change in appetite 0   Feeling bad or failure about yourself  1   Trouble  concentrating 0   Moving slowly or fidgety/restless 0   Suicidal thoughts 0   PHQ-9 Score 2   Difficult doing work/chores Not difficult at all     Interpretation of Total Score  Total Score Depression Severity:  1-4 = Minimal depression, 5-9 = Mild depression, 10-14 = Moderate depression, 15-19 = Moderately severe depression, 20-27 = Severe depression   Psychosocial Evaluation and Intervention:  Psychosocial Evaluation - 01/04/20 1346      Psychosocial Evaluation & Interventions   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 He is nervous about starting exercise, but also says he knows he needs to do something.    Expected Outcomes Short: attend [facility] for exercise and educaiton. Long: develop positive self care habits    Continue Psychosocial Services  Follow up required by staff           Psychosocial Re-Evaluation:  Psychosocial Re-Evaluation    [place] Name 01/23/20 [phone] 02/15/20 8182           Psychosocial Re-Evaluation   Current issues with Current Stress Concerns Current Stress Concerns      Comments Joe went on a work trip last week to [place] and freaked out a little getting out of car and through first leg.  After that, all was well and his confidence is starting to come back and improve. He has been sleeping well.  He is feeling better and calmer now that he is back home again. Joe is getting better mentally.  His fear is starting to subside.  He has started to see a counselor and thinks it is beginning to help.  He is sleeping better,  when the cat lets him.      Expected Outcomes Short: Get back into exercise routine for confidence build up  Long: Continue to improve mental outlook. Short: Exercise regularly for mental boost and continue to see counselor  Long; Continue to allow fear to dissapate.      Interventions Stress management  education;Encouraged to attend Cardiac Rehabilitation for the exercise Stress management education;Encouraged to attend Cardiac Rehabilitation for the exercise      Continue Psychosocial Services  Follow up required by staff Follow up required by staff             Psychosocial Discharge (Final Psychosocial Re-Evaluation):  Psychosocial Re-Evaluation - 02/15/20 0814      Psychosocial Re-Evaluation   Current issues with Current Stress Concerns    Comments Wille Glaser is getting better mentally.  His fear is starting to subside.  He has started to see a counselor and thinks it is beginning to help.  He is sleeping better, when the cat lets him.    Expected Outcomes Short: Exercise regularly for mental boost and continue to see counselor  Long; Continue to allow fear to dissapate.    Interventions Stress management education;Encouraged to attend Cardiac Rehabilitation for the exercise    Continue Psychosocial Services  Follow up required by staff           Vocational Rehabilitation: Provide vocational rehab assistance to qualifying candidates.   Vocational Rehab Evaluation & Intervention:  Vocational Rehab - 01/04/20 1336      Initial Vocational Rehab Evaluation & Intervention   Assessment shows need for Vocational Rehabilitation No           Education: Education Goals: Education classes will be provided on a variety of topics geared toward better understanding of heart health and risk factor modification. Participant will state understanding/return demonstration of topics presented as noted by education test scores.  Learning Barriers/Preferences:  Learning Barriers/Preferences - 01/04/20 1336      Learning Barriers/Preferences   Learning Barriers None    Learning Preferences None           General Cardiac Education Topics:  AED/CPR: - Group verbal and written instruction with the use of models to demonstrate the basic use of the AED with the basic ABC's of  resuscitation.   Anatomy & Physiology of the Heart: - Group verbal and written instruction and models provide basic cardiac anatomy and physiology, with the coronary electrical and arterial systems. Review of Valvular disease and Heart Failure   Cardiac Procedures: - Group verbal and written instruction to review commonly prescribed medications for heart disease. Reviews the medication, class of the drug, and side effects. Includes the steps to properly store meds and maintain the prescription regimen. (beta blockers and nitrates)   Cardiac Medications I: - Group verbal and written instruction to review commonly prescribed medications for heart disease. Reviews the medication, class of the drug, and side effects. Includes the steps to properly store meds and maintain the prescription regimen.   Cardiac Medications II: -Group verbal and written instruction to review commonly prescribed medications for heart disease. Reviews the medication, class of the drug, and side effects. (all other drug classes)    Go Sex-Intimacy & Heart Disease, Get SMART - Goal Setting: - Group verbal and written instruction through game format to discuss heart disease and the return to sexual intimacy. Provides group verbal and written material to discuss and apply goal setting through the application of the S.M.A.R.T. Method.   Other Matters  of the Heart: - Provides group verbal, written materials and models to describe Stable Angina and Peripheral Artery. Includes description of the disease process and treatment options available to the cardiac patient.   Infection Prevention: - Provides verbal and written material to individual with discussion of infection control including proper hand washing and proper equipment cleaning during exercise session.   Cardiac Rehab from 01/08/2020 in [facility] Cardiac and Pulmonary Rehab  Date 01/08/20  Educator [facility]  Instruction Review Code 1- Verbalizes Understanding       Falls Prevention: - Provides verbal and written material to individual with discussion of falls prevention and safety.   Cardiac Rehab from 01/08/2020 in [facility] Cardiac and Pulmonary Rehab  Date 01/08/20  Educator [facility]  Instruction Review Code 1- Verbalizes Understanding      Other: -Provides group and verbal instruction on various topics (see comments)   Knowledge Questionnaire Score:  Knowledge Questionnaire Score - 01/08/20 0925      Knowledge Questionnaire Score   Pre Score 25/26 Angina question only           Core Components/Risk Factors/Patient Goals at Admission:  Personal Goals and Risk Factors at Admission - 01/08/20 0924      Core Components/Risk Factors/Patient Goals on Admission    Weight Management Yes;Weight Maintenance    Intervention Weight Management: Develop a combined nutrition and exercise program designed to reach desired caloric intake, while maintaining appropriate intake of nutrient and fiber, sodium and fats, and appropriate energy expenditure required for the weight goal.;Weight Management: Provide education and appropriate resources to help participant work on and attain dietary goals.    Admit Weight 168 lb 8 oz (76.4 kg)    Goal Weight: Short Term 168 lb (76.2 kg)    Goal Weight: Long Term 168 lb (76.2 kg)    Expected Outcomes Short Term: Continue to assess and modify interventions until short term weight is achieved;Long Term: Adherence to nutrition and physical activity/exercise program aimed toward attainment of established weight goal;Weight Maintenance: Understanding of the daily nutrition guidelines, which includes 25-35% calories from fat, 7% or less cal from saturated fats, less than 242m cholesterol, less than 1.5gm of sodium, & 5 or more servings of fruits and vegetables daily    Diabetes Yes    Intervention Provide education about signs/symptoms and action to take for hypo/hyperglycemia.;Provide education about proper nutrition, including  hydration, and aerobic/resistive exercise prescription along with prescribed medications to achieve blood glucose in normal ranges: Fasting glucose 65-99 mg/dL    Expected Outcomes Short Term: Participant verbalizes understanding of the signs/symptoms and immediate care of hyper/hypoglycemia, proper foot care and importance of medication, aerobic/resistive exercise and nutrition plan for blood glucose control.;Long Term: Attainment of HbA1C < 7%.    Lipids Yes    Intervention Provide education and support for participant on nutrition & aerobic/resistive exercise along with prescribed medications to achieve LDL <746m HDL >4064m   Expected Outcomes Short Term: Participant states understanding of desired cholesterol values and is compliant with medications prescribed. Participant is following exercise prescription and nutrition guidelines.;Long Term: Cholesterol controlled with medications as prescribed, with individualized exercise RX and with personalized nutrition plan. Value goals: LDL < 40m82mDL > 40 mg.           Education:Diabetes - Individual verbal and written instruction to review signs/symptoms of diabetes, desired ranges of glucose level fasting, after meals and with exercise. Acknowledge that pre and post exercise glucose checks will be done for 3 sessions at entry of  program.   Cardiac Rehab from 01/08/2020 in [facility] Cardiac and Pulmonary Rehab  Date 01/04/20  Educator [facility]  Instruction Review Code 1- [employer] Understanding      Education: Know Your Numbers and Risk Factors: -Group verbal and written instruction about important numbers in your health.  Discussion of what are risk factors and how they play a role in the disease process.  Review of Cholesterol, Blood Pressure, Diabetes, and BMI and the role they play in your overall health.   Core Components/Risk Factors/Patient Goals Review:   Goals and Risk Factor Review    Row Name 01/23/20 0751 02/15/20 0810           Core  Components/Risk Factors/Patient Goals Review   Personal Goals Review Weight Management/Obesity;Hypertension;Diabetes Weight Management/Obesity;Hypertension;Diabetes      Review Wille Glaser is doing well in rehab. He has adjusted his eating habits some and his wait is down some.  Blood sugars have been steady for most part.  Blood pressures have been good in class and he will get back to it. Wille Glaser is doing well. His weight is holding steady overall. He is trying to keep a close eye on it to make sure he is not losing.  Blood sugars are staying lower overall.  Blood pressures are doing well in class, he has not started checking at home but will get to keep a log. Overall he is getting in to a good routine.      Expected Outcomes Short: Get into habit of checking pressure at home  Long: Continue to monitor risk factors. Short: Check pressure regularly Long; Continue to monitor risk factors.             Core Components/Risk Factors/Patient Goals at Discharge (Final Review):   Goals and Risk Factor Review - 02/15/20 0810      Core Components/Risk Factors/Patient Goals Review   Personal Goals Review Weight Management/Obesity;Hypertension;Diabetes    Review Wille Glaser is doing well. His weight is holding steady overall. He is trying to keep a close eye on it to make sure he is not losing.  Blood sugars are staying lower overall.  Blood pressures are doing well in class, he has not started checking at home but will get to keep a log. Overall he is getting in to a good routine.    Expected Outcomes Short: Check pressure regularly Long; Continue to monitor risk factors.           ITP Comments:  ITP Comments    Row Name 01/04/20 1344 01/08/20 0918 01/09/20 0847 01/10/20 0534 01/12/20 0958   ITP Comments Initial telephone orientation completed. Diagnosis can be found in [facility] 3/12. EP Orientation scheduled for 4/19 Completed 6MWT and gym orientation.  Initial ITP created and sent for review to Dr. Emily Filbert, Medical  Director. First full day of exercise!  Patient was oriented to gym and equipment including functions, settings, policies, and procedures.  Patient's individual exercise prescription and treatment plan were reviewed.  All starting workloads were established based on the results of the 6 minute walk test done at initial orientation visit.  The plan for exercise progression was also introduced and progression will be customized based on patient's performance and goals. 30 Day review completed. Medical Director review done, changes made as directed,and approval shown by signature of Market researcher. New to program Completed Initial RD Eval   Row Name 02/07/20 0543 03/06/20 0549         ITP Comments 30 Day review completed. ITP review  done, changes made as directed,and approval shown by signature of  Scientist, research (life sciences). 30 Day review completed. Medical Director ITP review done, changes made as directed, and signed approval by Medical Director.             Comments: 30 Day review completed. Medical Director ITP review done, changes made as directed, and signed approval by Medical Director.

## 2020-03-07 ENCOUNTER — Encounter: Payer: BLUE CROSS/BLUE SHIELD | Admitting: *Deleted

## 2020-03-07 ENCOUNTER — Other Ambulatory Visit: Payer: Self-pay

## 2020-03-07 DIAGNOSIS — I252 Old myocardial infarction: Secondary | ICD-10-CM | POA: Diagnosis not present

## 2020-03-07 DIAGNOSIS — Z955 Presence of coronary angioplasty implant and graft: Secondary | ICD-10-CM

## 2020-03-07 DIAGNOSIS — I214 Non-ST elevation (NSTEMI) myocardial infarction: Secondary | ICD-10-CM

## 2020-03-07 NOTE — Progress Notes (Signed)
Daily Session Note  Patient Details  Name: Jamie Roach MRN: [id number] Date of Birth: [birth year] Referring Provider:     Cardiac Rehab from 01/08/2020 in [facility] Cardiac and Pulmonary Rehab  Referring Provider Bartholome Bill MD      Encounter Date: 03/07/2020  Check In:  Session Check In - 03/07/20 0837      Check-In   Supervising physician immediately available to respond to emergencies See telemetry face sheet for immediately available ER MD    Location [facility]-Cardiac & Pulmonary Rehab    Staff Present Heath Lark, RN, BSN, CCRP;Melissa [place] RDN, Rowe Pavy, BA, ACSM CEP, Exercise Physiologist    Virtual Visit No    Medication changes reported     No    Fall or balance concerns reported    No    Warm-up and Cool-down Performed on first and last piece of equipment    Resistance Training Performed Yes    VAD Patient? No    PAD/SET Patient? No      Pain Assessment   Currently in Pain? No/denies              Social History   Tobacco Use  Smoking Status Never Smoker  Smokeless Tobacco Never Used    Goals Met:  Independence with exercise equipment Exercise tolerated well No report of cardiac concerns or symptoms  Goals Unmet:  Not Applicable  Comments: Pt able to follow exercise prescription today without complaint.  Will continue to monitor for progression.    Dr. Emily Filbert is Medical Director for [place] and [facility].

## 2020-03-12 ENCOUNTER — Encounter: Payer: BLUE CROSS/BLUE SHIELD | Admitting: *Deleted

## 2020-03-12 ENCOUNTER — Other Ambulatory Visit: Payer: Self-pay

## 2020-03-12 DIAGNOSIS — I252 Old myocardial infarction: Secondary | ICD-10-CM | POA: Diagnosis not present

## 2020-03-12 DIAGNOSIS — I214 Non-ST elevation (NSTEMI) myocardial infarction: Secondary | ICD-10-CM

## 2020-03-12 DIAGNOSIS — Z955 Presence of coronary angioplasty implant and graft: Secondary | ICD-10-CM

## 2020-03-12 NOTE — Progress Notes (Signed)
Daily Session Note  Patient Details  Name: Jamie Roach MRN: [id number] Date of Birth: 15-Feb-1966 Referring Provider:     Cardiac Rehab from 01/08/2020 in [facility] Cardiac and Pulmonary Rehab  Referring Provider Bartholome Bill MD      Encounter Date: 03/12/2020  Check In:  Session Check In - 03/12/20 0826      Check-In   Supervising physician immediately available to respond to emergencies See telemetry face sheet for immediately available ER MD    Location [facility]-Cardiac & Pulmonary Rehab    Staff Present Heath Lark, RN, BSN, CCRP;Rita Foy [place], [place], ACSM CEP, Exercise Physiologist    Virtual Visit No    Medication changes reported     No    Fall or balance concerns reported    No    Warm-up and Cool-down Performed on first and last piece of equipment    Resistance Training Performed Yes    VAD Patient? No    PAD/SET Patient? No      Pain Assessment   Currently in Pain? No/denies              Social History   Tobacco Use  Smoking Status Never Smoker  Smokeless Tobacco Never Used    Goals Met:  Independence with exercise equipment Personal goals reviewed No report of cardiac concerns or symptoms  Goals Unmet:  Not Applicable  Comments: Pt able to follow exercise prescription today without complaint.  Will continue to monitor for progression.    Dr. Emily Filbert is Medical Director for [place] and [facility].

## 2020-03-13 ENCOUNTER — Other Ambulatory Visit: Payer: Self-pay

## 2020-03-13 ENCOUNTER — Encounter: Payer: BLUE CROSS/BLUE SHIELD | Admitting: *Deleted

## 2020-03-13 DIAGNOSIS — I214 Non-ST elevation (NSTEMI) myocardial infarction: Secondary | ICD-10-CM

## 2020-03-13 DIAGNOSIS — Z955 Presence of coronary angioplasty implant and graft: Secondary | ICD-10-CM

## 2020-03-13 DIAGNOSIS — I252 Old myocardial infarction: Secondary | ICD-10-CM | POA: Diagnosis not present

## 2020-03-13 NOTE — Progress Notes (Signed)
Daily Session Note  Patient Details  Name: Jamie Roach MRN: [id number] Date of Birth: May 12, 1966 Referring Provider:     Cardiac Rehab from 01/08/2020 in [facility] Cardiac and Pulmonary Rehab  Referring Provider Bartholome Bill MD      Encounter Date: 03/13/2020  Check In:  Session Check In - 03/13/20 0851      Check-In   Supervising physician immediately available to respond to emergencies See telemetry face sheet for immediately available ER MD    Location [facility]-Cardiac & Pulmonary Rehab    Staff Present Heath Lark, RN, BSN, CCRP;Amanda Sommer, BA, ACSM CEP, Exercise Physiologist;Jessica [place], MA, RCEP, CCRP, CCET    Virtual Visit No    Medication changes reported     No    Fall or balance concerns reported    No    Warm-up and Cool-down Performed on first and last piece of equipment    Resistance Training Performed Yes    VAD Patient? No    PAD/SET Patient? No      Pain Assessment   Currently in Pain? No/denies              Social History   Tobacco Use  Smoking Status Never Smoker  Smokeless Tobacco Never Used    Goals Met:  Independence with exercise equipment Exercise tolerated well No report of cardiac concerns or symptoms  Goals Unmet:  Not Applicable  Comments: Pt able to follow exercise prescription today without complaint.  Will continue to monitor for progression.    Dr. Emily Filbert is Medical Director for [place] and [facility].

## 2020-03-19 ENCOUNTER — Encounter: Payer: BLUE CROSS/BLUE SHIELD | Admitting: *Deleted

## 2020-03-19 ENCOUNTER — Other Ambulatory Visit: Payer: Self-pay

## 2020-03-19 DIAGNOSIS — I252 Old myocardial infarction: Secondary | ICD-10-CM | POA: Diagnosis not present

## 2020-03-19 DIAGNOSIS — Z955 Presence of coronary angioplasty implant and graft: Secondary | ICD-10-CM

## 2020-03-19 DIAGNOSIS — I214 Non-ST elevation (NSTEMI) myocardial infarction: Secondary | ICD-10-CM

## 2020-03-19 NOTE — Progress Notes (Signed)
Daily Session Note  Patient Details  Name: Jamie Roach MRN: [id number] Date of Birth: Feb 09, 1966 Referring Provider:     Cardiac Rehab from 01/08/2020 in [facility] Cardiac and Pulmonary Rehab  Referring Provider Bartholome Bill MD      Encounter Date: 03/19/2020  Check In:  Session Check In - 03/19/20 0941      Check-In   Supervising physician immediately available to respond to emergencies See telemetry face sheet for immediately available ER MD    Location [facility]-Cardiac & Pulmonary Rehab    Staff Present Heath Lark, RN, BSN, CCRP;Amanda Sommer, BA, ACSM CEP, Exercise Physiologist;Shariyah Eland [place], MA, RCEP, CCRP, CCET;Eliezer [employer] Visit No    Medication changes reported     No    Fall or balance concerns reported    No    Warm-up and Cool-down Performed on first and last piece of equipment    Resistance Training Performed Yes    VAD Patient? No    PAD/SET Patient? No      Pain Assessment   Currently in Pain? No/denies              Social History   Tobacco Use  Smoking Status Never Smoker  Smokeless Tobacco Never Used    Goals Met:  Independence with exercise equipment Exercise tolerated well No report of cardiac concerns or symptoms Strength training completed today  Goals Unmet:  Not Applicable  Comments: Pt able to follow exercise prescription today without complaint.  Will continue to monitor for progression.    Dr. Emily Filbert is Medical Director for [place] and [facility].

## 2020-03-21 ENCOUNTER — Encounter: Payer: BLUE CROSS/BLUE SHIELD | Attending: Cardiology | Admitting: *Deleted

## 2020-03-21 ENCOUNTER — Other Ambulatory Visit: Payer: Self-pay

## 2020-03-21 DIAGNOSIS — Z955 Presence of coronary angioplasty implant and graft: Secondary | ICD-10-CM | POA: Insufficient documentation

## 2020-03-21 DIAGNOSIS — I252 Old myocardial infarction: Secondary | ICD-10-CM | POA: Insufficient documentation

## 2020-03-21 DIAGNOSIS — I214 Non-ST elevation (NSTEMI) myocardial infarction: Secondary | ICD-10-CM

## 2020-03-21 NOTE — Progress Notes (Signed)
Daily Session Note  Patient Details  Name: Jamie Roach MRN: [id number] Date of Birth: [birth year] Referring Provider:     Cardiac Rehab from 01/08/2020 in [facility] Cardiac and Pulmonary Rehab  Referring Provider Bartholome Bill MD      Encounter Date: 03/21/2020  Check In:  Session Check In - 03/21/20 0750      Check-In   Supervising physician immediately available to respond to emergencies See telemetry face sheet for immediately available ER MD    Location [facility]-Cardiac & Pulmonary Rehab    Staff Present Nyoka Cowden, RN, BSN, [place]lette [place], MA, RCEP, CCRP, [place], [place], ACSM CEP, Exercise Physiologist    Virtual Visit No    Fall or balance concerns reported    No    Tobacco Cessation No Change    Warm-up and Cool-down Performed on first and last piece of equipment    PAD/SET Patient? No      Pain Assessment   Currently in Pain? No/denies              Social History   Tobacco Use  Smoking Status Never Smoker  Smokeless Tobacco Never Used    Goals Met:  Independence with exercise equipment Exercise tolerated well No report of cardiac concerns or symptoms Strength training completed today  Goals Unmet:  Not Applicable  Comments: Pt able to follow exercise prescription today without complaint.  Will continue to monitor for progression.   Dr. Emily Filbert is Medical Director for [place] and [facility].

## 2020-03-26 ENCOUNTER — Encounter: Payer: BLUE CROSS/BLUE SHIELD | Admitting: *Deleted

## 2020-03-26 ENCOUNTER — Other Ambulatory Visit: Payer: Self-pay

## 2020-03-26 DIAGNOSIS — I214 Non-ST elevation (NSTEMI) myocardial infarction: Secondary | ICD-10-CM

## 2020-03-26 DIAGNOSIS — Z955 Presence of coronary angioplasty implant and graft: Secondary | ICD-10-CM

## 2020-03-26 DIAGNOSIS — I252 Old myocardial infarction: Secondary | ICD-10-CM | POA: Diagnosis not present

## 2020-03-26 NOTE — Progress Notes (Signed)
Daily Session Note  Patient Details  Name: Jamie Roach MRN: [id number] Date of Birth: December 16, 1965 Referring Provider:     Cardiac Rehab from 01/08/2020 in [facility] Cardiac and Pulmonary Rehab  Referring Provider Bartholome Bill MD      Encounter Date: 03/26/2020  Check In:  Session Check In - 03/26/20 4656      Check-In   Supervising physician immediately available to respond to emergencies See telemetry face sheet for immediately available ER MD    Location [facility]-Cardiac & Pulmonary Rehab    Staff Present Heath Lark, RN, BSN, Lance Sell, BA, ACSM CEP, Exercise Physiologist;Kara Eliezer [place], MS Exercise Physiologist    Virtual Visit No    Medication changes reported     No    Fall or balance concerns reported    No    Warm-up and Cool-down Performed on first and last piece of equipment    Resistance Training Performed Yes    VAD Patient? No    PAD/SET Patient? No      Pain Assessment   Currently in Pain? No/denies              Social History   Tobacco Use  Smoking Status Never Smoker  Smokeless Tobacco Never Used    Goals Met:  Independence with exercise equipment Exercise tolerated well No report of cardiac concerns or symptoms  Goals Unmet:  Not Applicable  Comments: Pt able to follow exercise prescription today without complaint.  Will continue to monitor for progression.    Dr. Emily Filbert is Medical Director for [place] and [facility].

## 2020-03-27 ENCOUNTER — Encounter: Payer: BLUE CROSS/BLUE SHIELD | Admitting: *Deleted

## 2020-03-27 ENCOUNTER — Other Ambulatory Visit: Payer: Self-pay

## 2020-03-27 DIAGNOSIS — I252 Old myocardial infarction: Secondary | ICD-10-CM | POA: Diagnosis not present

## 2020-03-27 DIAGNOSIS — I214 Non-ST elevation (NSTEMI) myocardial infarction: Secondary | ICD-10-CM

## 2020-03-27 DIAGNOSIS — Z955 Presence of coronary angioplasty implant and graft: Secondary | ICD-10-CM

## 2020-03-27 NOTE — Progress Notes (Signed)
Daily Session Note  Patient Details  Name: Jamie Roach MRN: [id number] Date of Birth: 07/28/1966 Referring Provider:     Cardiac Rehab from 01/08/2020 in [facility] Cardiac and Pulmonary Rehab  Referring Provider Bartholome Bill MD      Encounter Date: 03/27/2020  Check In:  Session Check In - 03/27/20 0723      Check-In   Supervising physician immediately available to respond to emergencies See telemetry face sheet for immediately available ER MD    Location [facility]-Cardiac & Pulmonary Rehab    Staff Present Renita Papa, RN BSN;Acie [address] [place], [place], [place], CCRP, [place], [place], ACSM CEP, Exercise Physiologist    Virtual Visit No    Medication changes reported     No    Fall or balance concerns reported    No    Warm-up and Cool-down Performed on first and last piece of equipment    Resistance Training Performed Yes    VAD Patient? No    PAD/SET Patient? No      Pain Assessment   Currently in Pain? No/denies              Social History   Tobacco Use  Smoking Status Never Smoker  Smokeless Tobacco Never Used    Goals Met:  Independence with exercise equipment Exercise tolerated well No report of cardiac concerns or symptoms Strength training completed today  Goals Unmet:  Not Applicable  Comments: Pt able to follow exercise prescription today without complaint.  Will continue to monitor for progression.    Dr. Emily Filbert is Medical Director for [place] and [facility].

## 2020-04-03 ENCOUNTER — Encounter: Payer: Self-pay | Admitting: *Deleted

## 2020-04-03 DIAGNOSIS — I214 Non-ST elevation (NSTEMI) myocardial infarction: Secondary | ICD-10-CM

## 2020-04-03 DIAGNOSIS — Z955 Presence of coronary angioplasty implant and graft: Secondary | ICD-10-CM

## 2020-04-03 NOTE — Progress Notes (Signed)
Cardiac Individual Treatment Plan  Patient Details  Name: Jamie Roach MRN: [id number] Date of Birth: [birth year] Referring Provider:     Cardiac Rehab from 01/08/2020 in [facility] Cardiac and Pulmonary Rehab  Referring Provider Bartholome Bill MD      Initial Encounter Date:    Cardiac Rehab from 01/08/2020 in [facility] Cardiac and Pulmonary Rehab  Date 01/08/20      Visit Diagnosis: NSTEMI (non-ST elevated myocardial infarction) [facility])  Status post coronary artery stent placement  Patient's Home Medications on Admission:  Current Outpatient Medications:  .  aspirin 81 MG EC tablet, TAKE 1 TABLET(81 MG) BY MOUTH DAILY, Disp: , Rfl:  .  atorvastatin (LIPITOR) 80 MG tablet, Take 1 tablet (80 mg total) by mouth daily at 6 PM., Disp: 30 tablet, Rfl: 0 .  atorvastatin (LIPITOR) 80 MG tablet, TAKE 1 TABLET(80 MG) BY MOUTH DAILY AT 6 PM, Disp: , Rfl:  .  Cholecalciferol 25 MCG (1000 UT) capsule, Take by mouth., Disp: , Rfl:  .  Continuous Blood Gluc Receiver (FREESTYLE LIBRE 14 DAY READER) DEVI, Use 1 kit as directed E10.649, Disp: , Rfl:  .  Lancets (FREESTYLE) lancets, USE TO CHECK BLOOD SUGAR 6 TIMES A DAY, Disp: , Rfl:  .  metoprolol tartrate (LOPRESSOR) 25 MG tablet, Take 0.5 tablets (12.5 mg total) by mouth 2 (two) times daily., Disp: 30 tablet, Rfl: 0 .  Multiple Vitamin (MULTI-VITAMIN) tablet, Take by mouth., Disp: , Rfl:  .  NOVOLOG 100 UNIT/ML injection, SMARTSIG:75 Unit(s) SUB-Q Daily, Disp: , Rfl:  .  prasugrel (EFFIENT) 10 MG TABS tablet, Take by mouth., Disp: , Rfl:   Past Medical History: Past Medical History:  Diagnosis Date  . Type 1 diabetes (HCC)     Tobacco Use: Social History   Tobacco Use  Smoking Status Never Smoker  Smokeless Tobacco Never Used    Labs: Recent Review Scientist, physiological    Labs for ITP Cardiac and Pulmonary Rehab Latest Ref Rng & Units 12/01/2019   Cholestrol 0 - 200 mg/dL 193   LDLCALC 0 - 99 mg/dL 140(H)   HDL >40 mg/dL 49    Trlycerides <150 mg/dL 22   Hemoglobin A1c 4.8 - 5.6 % 6.7(H)       Exercise Target Goals: Exercise Program Goal: Individual exercise prescription set using results from initial 6 min walk test and THRR while considering  patient's activity barriers and safety.   Exercise Prescription Goal: Initial exercise prescription builds to 30-45 minutes a day of aerobic activity, 2-3 days per week.  Home exercise guidelines will be given to patient during program as part of exercise prescription that the participant will acknowledge.   Education: Aerobic Exercise & Resistance Training: - Gives group verbal and written instruction on the various components of exercise. Focuses on aerobic and resistive training programs and the benefits of this training and how to safely progress through these programs..   Education: Exercise & Equipment Safety: - Individual verbal instruction and demonstration of equipment use and safety with use of the equipment.   Cardiac Rehab from 01/08/2020 in [facility] Cardiac and Pulmonary Rehab  Date 01/08/20  Educator [facility]  Instruction Review Code 1- Verbalizes Understanding      Education: Exercise Physiology & General Exercise Guidelines: - Group verbal and written instruction with models to review the exercise physiology of the cardiovascular system and associated critical values. Provides general exercise guidelines with specific guidelines to those with heart or lung disease.    Education: Flexibility, Balance,  Mind/Body Relaxation: Provides group verbal/written instruction on the benefits of flexibility and balance training, including mind/body exercise modes such as yoga, pilates and tai chi.  Demonstration and skill practice provided.   Activity Barriers & Risk Stratification:  Activity Barriers & Cardiac Risk Stratification - 01/08/20 0919      Activity Barriers & Cardiac Risk Stratification   Activity Barriers Other (comment)    Comments very nervous and  scared of exertion    Cardiac Risk Stratification Moderate           6 Minute Walk:  6 Minute Walk    Row Name 01/08/20 0918         6 Minute Walk   Phase Initial     Distance 1400 feet     Walk Time 6 minutes     # of Rest Breaks 0     MPH 2.65     METS 4.24     RPE 7     VO2 Peak 14.83     Symptoms No     Resting HR 67 bpm     Resting BP 142/74  very nervous     Resting Oxygen Saturation  99 %     Exercise Oxygen Saturation  during 6 min walk 98 %     Max Ex. HR 93 bpm     Max Ex. BP 132/74     2 Minute Post BP 128/74            Oxygen Initial Assessment:   Oxygen Re-Evaluation:   Oxygen Discharge (Final Oxygen Re-Evaluation):   Initial Exercise Prescription:  Initial Exercise Prescription - 01/08/20 0900      Date of Initial Exercise RX and Referring Provider   Date 01/08/20    Referring Provider Bartholome Bill MD      Treadmill   MPH 2.6    Grade 1    Minutes 15    METs 3.3      Elliptical   Level 1    Speed 4.2    Minutes 15    METs 3      REL-XR   Level 3    Speed 50    Minutes 15    METs 3      T5 Nustep   Level 3    SPM 80    Minutes 15    METs 3      Prescription Details   Frequency (times per week) 2    Duration Progress to 30 minutes of continuous aerobic without signs/symptoms of physical distress      Intensity   THRR 40-80% of Max Heartrate 107-148    Ratings of Perceived Exertion 11-13    Perceived Dyspnea 0-4      Progression   Progression Continue to progress workloads to maintain intensity without signs/symptoms of physical distress.      Resistance Training   Training Prescription Yes    Weight 4 lb    Reps 10-15           Perform Capillary Blood Glucose checks as needed.  Exercise Prescription Changes:  Exercise Prescription Changes    Row Name 01/08/20 0900 01/10/20 1100 01/15/20 1300 01/23/20 1500 02/05/20 1600     Response to Exercise   Blood Pressure (Admit) 142/74 132/60 -- 124/66 114/60    Blood Pressure (Exercise) 132/74 134/62 -- 138/58 154/56   Blood Pressure (Exit) 128/74 112/64 -- 102/52 112/64   Heart Rate (Admit) 67 bpm 87 bpm -- 87 bpm  91 bpm   Heart Rate (Exercise) 93 bpm 108 bpm -- 103 bpm 147 bpm   Heart Rate (Exit) 65 bpm 69 bpm -- 72 bpm 99 bpm   Oxygen Saturation (Admit) 99 % -- -- -- --   Oxygen Saturation (Exercise) 98 % -- -- -- --   Rating of Perceived Exertion (Exercise) 7 11 -- 11 14   Symptoms none, very nervours and scared none -- none none   Comments walk test results first full day of exercise -- -- --   Duration -- Continue with 30 min of aerobic exercise without signs/symptoms of physical distress. -- Continue with 30 min of aerobic exercise without signs/symptoms of physical distress. Continue with 30 min of aerobic exercise without signs/symptoms of physical distress.   Intensity -- THRR unchanged -- THRR unchanged THRR unchanged     Progression   Progression -- Continue to progress workloads to maintain intensity without signs/symptoms of physical distress. -- Continue to progress workloads to maintain intensity without signs/symptoms of physical distress. Continue to progress workloads to maintain intensity without signs/symptoms of physical distress.   Average METs -- 2.98 -- 3.35 5.49     Resistance Training   Training Prescription -- Yes -- Yes Yes   Weight -- 4 lb -- 4 lb 4 lb   Reps -- 10-15 -- 10-15 10-15     Interval Training   Interval Training -- No -- No No     Treadmill   MPH -- 2.6 -- 2.6 2.6   Grade -- 1 -- 1 3   Minutes -- 15 -- 15 15   METs -- 3.35 -- 3.36 4.07     Elliptical   Level -- -- -- -- 1   Speed -- -- -- -- 4.2   Minutes -- -- -- -- 15     REL-XR   Level -- -- -- 4 4   Speed -- -- -- 50 --   Minutes -- -- -- 15 15   METs -- -- -- -- 6.9     T5 Nustep   Level -- 3 -- -- --   Minutes -- 15 -- -- --   METs -- 2.6 -- -- --     Home Exercise Plan   Plans to continue exercise at -- -- Home (comment)   walking Home (comment)  walking Home (comment)  walking   Frequency -- -- Add 3 additional days to program exercise sessions. Add 3 additional days to program exercise sessions. Add 3 additional days to program exercise sessions.   Initial Home Exercises Provided -- -- 01/15/20 01/15/20 01/15/20   Row Name 02/22/20 0900 03/07/20 1400 03/19/20 1400 04/01/20 1400       Response to Exercise   Blood Pressure (Admit) 108/64 104/54 128/64 132/70    Blood Pressure (Exercise) 144/70 142/68 154/60 152/62    Blood Pressure (Exit) 124/64 104/62 106/54 100/56    Heart Rate (Admit) 106 bpm 73 bpm 80 bpm 88 bpm    Heart Rate (Exercise) 152 bpm 124 bpm 136 bpm 164 bpm    Heart Rate (Exit) 93 bpm 84 bpm 115 bpm 103 bpm    Rating of Perceived Exertion (Exercise) 13 13 12 13     Symptoms none none none none    Duration Continue with 30 min of aerobic exercise without signs/symptoms of physical distress. Continue with 30 min of aerobic exercise without signs/symptoms of physical distress. Continue with 30 min of aerobic exercise without signs/symptoms of physical distress.  Continue with 30 min of aerobic exercise without signs/symptoms of physical distress.    Intensity THRR unchanged THRR unchanged THRR unchanged THRR unchanged      Progression   Progression Continue to progress workloads to maintain intensity without signs/symptoms of physical distress. Continue to progress workloads to maintain intensity without signs/symptoms of physical distress. Continue to progress workloads to maintain intensity without signs/symptoms of physical distress. Continue to progress workloads to maintain intensity without signs/symptoms of physical distress.    Average METs 6.25 7.33 6.99 9      Resistance Training   Training Prescription Yes Yes Yes Yes    Weight 5 lb 8 lb 8 lb 8 lb    Reps 10-15 10-15 10-15 10-15      Interval Training   Interval Training No No No Yes    Equipment -- -- -- Treadmill;Elliptical;REL-XR     Comments -- -- -- 1 min on 1 min off      Treadmill   MPH 3.8 3.7 3.7 3.7    Grade 4.5 5 7 7     Minutes 15 15 15 15     METs 6.27 6.53 7.4 7.4      Elliptical   Level 4 8 8  7.5    Speed -- 2.5 2.5 5    Minutes 15 15 15 15     METs -- 4.9 6.57 --      REL-XR   Level -- 10 10 10     Minutes -- 15 15 15     METs -- 10.9 8.9 10.6      T5 Nustep   Level -- 4 4 --    Minutes -- 15 15 --    METs -- -- 6.3 --      Home Exercise Plan   Plans to continue exercise at -- Home (comment)  walking Home (comment)  walking Home (comment)  walking    Frequency -- Add 3 additional days to program exercise sessions. Add 3 additional days to program exercise sessions. Add 3 additional days to program exercise sessions.    Initial Home Exercises Provided -- 01/15/20 01/15/20 01/15/20           Exercise Comments:  Exercise Comments    Row Name 01/09/20 0848           Exercise Comments First full day of exercise!  Patient was oriented to gym and equipment including functions, settings, policies, and procedures.  Patient's individual exercise prescription and treatment plan were reviewed.  All starting workloads were established based on the results of the 6 minute walk test done at initial orientation visit.  The plan for exercise progression was also introduced and progression will be customized based on patient's performance and goals.              Exercise Goals and Review:  Exercise Goals    Row Name 01/08/20 0923             Exercise Goals   Increase Physical Activity Yes       Intervention Provide advice, education, support and counseling about physical activity/exercise needs.;Develop an individualized exercise prescription for aerobic and resistive training based on initial evaluation findings, risk stratification, comorbidities and participant's personal goals.       Expected Outcomes Short Term: Attend rehab on a regular basis to increase amount of physical activity.;Long  Term: Add in home exercise to make exercise part of routine and to increase amount of physical activity.;Long Term: Exercising regularly at least 3-5 days  a week.       Increase Strength and Stamina Yes       Intervention Provide advice, education, support and counseling about physical activity/exercise needs.;Develop an individualized exercise prescription for aerobic and resistive training based on initial evaluation findings, risk stratification, comorbidities and participant's personal goals.       Expected Outcomes Short Term: Increase workloads from initial exercise prescription for resistance, speed, and METs.;Short Term: Perform resistance training exercises routinely during rehab and add in resistance training at home;Long Term: Improve cardiorespiratory fitness, muscular endurance and strength as measured by increased METs and functional capacity (6MWT)       Able to understand and use rate of perceived exertion (RPE) scale Yes       Intervention Provide education and explanation on how to use RPE scale       Expected Outcomes Short Term: Able to use RPE daily in rehab to express subjective intensity level;Long Term:  Able to use RPE to guide intensity level when exercising independently       Able to understand and use Dyspnea scale Yes       Intervention Provide education and explanation on how to use Dyspnea scale       Expected Outcomes Short Term: Able to use Dyspnea scale daily in rehab to express subjective sense of shortness of breath during exertion;Long Term: Able to use Dyspnea scale to guide intensity level when exercising independently       Knowledge and understanding of Target Heart Rate Range (THRR) Yes       Intervention Provide education and explanation of THRR including how the numbers were predicted and where they are located for reference       Expected Outcomes Short Term: Able to state/look up THRR;Short Term: Able to use daily as guideline for intensity in rehab;Long  Term: Able to use THRR to govern intensity when exercising independently       Able to check pulse independently Yes       Intervention Provide education and demonstration on how to check pulse in carotid and radial arteries.;Review the importance of being able to check your own pulse for safety during independent exercise       Expected Outcomes Short Term: Able to explain why pulse checking is important during independent exercise;Long Term: Able to check pulse independently and accurately       Understanding of Exercise Prescription Yes       Intervention Provide education, explanation, and written materials on patient's individual exercise prescription       Expected Outcomes Long Term: Able to explain home exercise prescription to exercise independently;Short Term: Able to explain program exercise prescription              Exercise Goals Re-Evaluation :  Exercise Goals Re-Evaluation    Row Name 01/09/20 0851 01/15/20 1305 01/23/20 0745 01/23/20 1550 02/05/20 1556     Exercise Goal Re-Evaluation   Exercise Goals Review Able to understand and use rate of perceived exertion (RPE) scale;Knowledge and understanding of Target Heart Rate Range (THRR);Understanding of Exercise Prescription Increase Physical Activity;Increase Strength and Stamina;Able to understand and use rate of perceived exertion (RPE) scale;Able to understand and use Dyspnea scale;Knowledge and understanding of Target Heart Rate Range (THRR);Able to check pulse independently;Understanding of Exercise Prescription Increase Physical Activity;Increase Strength and Stamina;Understanding of Exercise Prescription Increase Physical Activity;Increase Strength and Stamina;Able to understand and use rate of perceived exertion (RPE) scale;Able to understand and use Dyspnea scale;Knowledge and understanding of Target  Heart Rate Range (THRR);Able to check pulse independently;Understanding of Exercise Prescription Increase Physical  Activity;Increase Strength and Stamina;Understanding of Exercise Prescription   Comments Reviewed RPE and dyspnea scales, THR and program prescription with pt today.  Pt voiced understanding and was given a copy of goals to take home. Reviewed home exercise with pt today.  Pt plans to continue to walk at home for exercise.  Also talked about using staff videos as well for exercise.  Reviewed THR, pulse, RPE, sign and symptoms, NTG use, and when to call 911 or MD.  Also discussed weather considerations and indoor options.  Pt voiced understanding. Joe was out the end of last week for a work trip.  He did not exercise while he was gone but plans to get back to it now that they are back.  He is starting to get back his strength and stamina and regaining condfidence too. -- Wille Glaser is doing well in rehab.  He is now back to driving again. He even said that he liked the elliptical and enjoys coming to class!  He is up to 6.9 METs on XR.  We will continue to monitor his progress.   Expected Outcomes Short: Use RPE daily to regulate intensity. Long: Follow program prescription in THR. Short: Start to add in exercise at home on off days from rehab.  Long: Continue to improve stamina and confidence Short: Continue to gain confidence in heart again  Long: Continue to improve stamina and get back to normal routine -- Short: Continue to improve on elliptical Long: Continue to get back to normal life again.   [place] Name 02/15/20 3810 02/22/20 0915 03/07/20 1414 03/13/20 0744 03/19/20 1436     Exercise Goal Re-Evaluation   Exercise Goals Review Increase Physical Activity;Increase Strength and Stamina;Understanding of Exercise Prescription Increase Physical Activity;Increase Strength and Stamina;Understanding of Exercise Prescription Increase Physical Activity;Increase Strength and Stamina;Understanding of Exercise Prescription Increase Physical Activity;Increase Strength and Stamina;Understanding of Exercise Prescription Increase  Physical Activity;Increase Strength and Stamina;Understanding of Exercise Prescription   Comments Wille Glaser is doing well in rehab.  He walks at work but not much else.  He has a bike that he is going to start to use.  He has been doing a lot of yard work.  His stamina is getting better but he is still very tired in the evening and has some flutters.  We talked about talking to the doc about it. Joe has made great progress and increased workloads and overall MET level.  He has also moved up on weights strength training. Wille Glaser has been doing well in rehab.  He doing well on the ellipitcal and with intervals.  He is now on level 10 for the XR.  We will continue to monitor his progress. Wille Glaser is doing well in rehab.  His confidence is getting better in his ability to exercise again.  He is walking on his off days at home.  He is feeling stronger and has more stamina.  They are going to the beach this weekend. Joe continues to to yield good results in rehab and is able to exercise independently outside of the program.   Expected Outcomes Short: Talk to doc abotu flutters and add in more exercise  Long: Continue to build stamina back up. Short: continue to attend consistently Long:  continue to progress workloads Short: Continue with intervals  Long; Continue to improve stamina and confidence Short: Continue with intervals  Long; Continue to improve stamina and confidence Short: Continue to walk  on his off days Long: Increase endurance and strength, maintain independent exercise at home in appropriate HR ranges   Row Name 04/01/20 1432             Exercise Goal Re-Evaluation   Exercise Goals Review Increase Physical Activity;Increase Strength and Stamina;Understanding of Exercise Prescription       Comments Joe continues to do well in rehab. He is out on vacation visiting family this week.  He was very excited to be going and having the confidence to go again.  He is up to level 10 on the XR with intervals.  He is also  at 7% grade on the treadmill.  We will continue to montior his progress.       Expected Outcomes Short: Contineu to use intervals and exercise on vacation Long: Continue to exercise independently with more confidence              Discharge Exercise Prescription (Final Exercise Prescription Changes):  Exercise Prescription Changes - 04/01/20 1400      Response to Exercise   Blood Pressure (Admit) 132/70    Blood Pressure (Exercise) 152/62    Blood Pressure (Exit) 100/56    Heart Rate (Admit) 88 bpm    Heart Rate (Exercise) 164 bpm    Heart Rate (Exit) 103 bpm    Rating of Perceived Exertion (Exercise) 13    Symptoms none    Duration Continue with 30 min of aerobic exercise without signs/symptoms of physical distress.    Intensity THRR unchanged      Progression   Progression Continue to progress workloads to maintain intensity without signs/symptoms of physical distress.    Average METs 9      Resistance Training   Training Prescription Yes    Weight 8 lb    Reps 10-15      Interval Training   Interval Training Yes    Equipment Treadmill;Elliptical;REL-XR    Comments 1 min on 1 min off      Treadmill   MPH 3.7    Grade 7    Minutes 15    METs 7.4      Elliptical   Level 7.5    Speed 5    Minutes 15      REL-XR   Level 10    Minutes 15    METs 10.6      Home Exercise Plan   Plans to continue exercise at Home (comment)   walking   Frequency Add 3 additional days to program exercise sessions.    Initial Home Exercises Provided 01/15/20           Nutrition:  Target Goals: Understanding of nutrition guidelines, daily intake of sodium <1577m, cholesterol <2041m calories 30% from fat and 7% or less from saturated fats, daily to have 5 or more servings of fruits and vegetables.  Education: Controlling Sodium/Reading Food Labels -Group verbal and written material supporting the discussion of sodium use in heart healthy nutrition. Review and explanation with  models, verbal and written materials for utilization of the food label.   Education: General Nutrition Guidelines/Fats and Fiber: -Group instruction provided by verbal, written material, models and posters to present the general guidelines for heart healthy nutrition. Gives an explanation and review of dietary fats and fiber.   Biometrics:  Pre Biometrics - 01/08/20 0924      Pre Biometrics   Height 5' 9.75" (1.772 m)    Weight 168 lb 8 oz (76.4 kg)  BMI (Calculated) 24.34    Single Leg Stand 30 seconds            Nutrition Therapy Plan and Nutrition Goals:  Nutrition Therapy & Goals - 01/12/20 0951      Nutrition Therapy   Diet Low Na, Heart Healthy    Protein (specify units) 65-70g    Fiber 30 grams    Whole Grain Foods 3 servings    Saturated Fats 12 max. grams    Fruits and Vegetables 5 servings/day    Sodium 1.5 grams      Personal Nutrition Goals   Nutrition Goal ST: increase variety LT: feel sercure to exercise outside of rehab    Comments Pt having smoothies BID with a meal of chicken and vegetables for dinner. Smoothies include supplements such as vitamin D, greens supplement, other greens, walnuts, berries, and collegen protein powder as well as pea protein. Discussed heart healthy eating, sufficient protein, variety, microbiome, satiety, honoring hunger, social aspects of food, and answered pt and pt wife questions.      Intervention Plan   Intervention Prescribe, educate and counsel regarding individualized specific dietary modifications aiming towards targeted core components such as weight, hypertension, lipid management, diabetes, heart failure and other comorbidities.;Nutrition handout(s) given to patient.    Expected Outcomes Short Term Goal: Understand basic principles of dietary content, such as calories, fat, sodium, cholesterol and nutrients.;Short Term Goal: A plan has been developed with personal nutrition goals set during dietitian appointment.;Long  Term Goal: Adherence to prescribed nutrition plan.           Nutrition Assessments:  Nutrition Assessments - 01/08/20 0926      MEDFICTS Scores   Pre Score 30           MEDIFICTS Score Key:          ?70 Need to make dietary changes          40-70 Heart Healthy Diet         ? 40 Therapeutic Level Cholesterol Diet  Nutrition Goals Re-Evaluation:  Nutrition Goals Re-Evaluation    [place] Name 03/13/20 0816             Goals   Nutrition Goal ST: increase variety LT: feel sercure to exercise outside of rehab       Comment Joe continues to make improvements to his diet.  He is getting more comfortable with trying new things and adding to his diet.       Expected Outcome Short: Continue to increase variety  Long: Continue to eat heart healthy.              Nutrition Goals Discharge (Final Nutrition Goals Re-Evaluation):  Nutrition Goals Re-Evaluation - 03/13/20 0816      Goals   Nutrition Goal ST: increase variety LT: feel sercure to exercise outside of rehab    Comment Joe continues to make improvements to his diet.  He is getting more comfortable with trying new things and adding to his diet.    Expected Outcome Short: Continue to increase variety  Long: Continue to eat heart healthy.           Psychosocial: Target Goals: Acknowledge presence or absence of significant depression and/or stress, maximize coping skills, provide positive support system. Participant is able to verbalize types and ability to use techniques and skills needed for reducing stress and depression.   Education: Depression - Provides group verbal and written instruction on the correlation between heart/lung disease and depressed mood,  treatment options, and the stigmas associated with seeking treatment.   Education: Sleep Hygiene -Provides group verbal and written instruction about how sleep can affect your health.  Define sleep hygiene, discuss sleep cycles and impact of sleep habits. Review good  sleep hygiene tips.     Education: Stress and Anxiety: - Provides group verbal and written instruction about the health risks of elevated stress and causes of high stress.  Discuss the correlation between heart/lung disease and anxiety and treatment options. Review healthy ways to manage with stress and anxiety.    Initial Review & Psychosocial Screening:  Initial Psych Review & Screening - 01/04/20 1345      Initial Review   Current issues with Current Stress Concerns    Comments post MI and stent      Family Dynamics   Good Support System? Yes      Barriers   Psychosocial barriers to participate in program There are no identifiable barriers or psychosocial needs.;The patient should benefit from training in stress management and relaxation.      Screening Interventions   Interventions To provide support and resources with identified psychosocial needs    Expected Outcomes Short Term goal: Utilizing psychosocial counselor, staff and physician to assist with identification of specific Stressors or current issues interfering with healing process. Setting desired goal for each stressor or current issue identified.;Long Term Goal: Stressors or current issues are controlled or eliminated.;Short Term goal: Identification and review with participant of any Quality of Life or Depression concerns found by scoring the questionnaire.;Long Term goal: The participant improves quality of Life and PHQ9 Scores as seen by post scores and/or verbalization of changes           Quality of Life Scores:   Quality of Life - 01/08/20 0940      Quality of Life   Select Quality of Life      Quality of Life Scores   Health/Function Pre 26.57 %    Socioeconomic Pre 30 %    Psych/Spiritual Pre 28.29 %    Family Pre 25.5 %    GLOBAL Pre 27.56 %          Scores of 19 and below usually indicate a poorer quality of life in these areas.  A difference of  2-3 points is a clinically meaningful difference.   A difference of 2-3 points in the total score of the Quality of Life Index has been associated with significant improvement in overall quality of life, self-image, physical symptoms, and general health in studies assessing change in quality of life.  PHQ-9: Recent Review Flowsheet Data    Depression screen [facility] 2/9 01/08/2020   Decreased Interest 0   Down, Depressed, Hopeless 0   PHQ - 2 Score 0   Altered sleeping 0   Tired, decreased energy 1   Change in appetite 0   Feeling bad or failure about yourself  1   Trouble concentrating 0   Moving slowly or fidgety/restless 0   Suicidal thoughts 0   PHQ-9 Score 2   Difficult doing work/chores Not difficult at all     Interpretation of Total Score  Total Score Depression Severity:  1-4 = Minimal depression, 5-9 = Mild depression, 10-14 = Moderate depression, 15-19 = Moderately severe depression, 20-27 = Severe depression   Psychosocial Evaluation and Intervention:  Psychosocial Evaluation - 01/04/20 1346      Psychosocial Evaluation & Interventions   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 He  does report having increased stress post MI, says its better at work because he can get into a routine easier. He is nervous about starting exercise, but also says he knows he needs to do something.    Expected Outcomes Short: attend [facility] for exercise and educaiton. Long: develop positive self care habits    Continue Psychosocial Services  Follow up required by staff           Psychosocial Re-Evaluation:  Psychosocial Re-Evaluation    [place] Name 01/23/20 [phone] 02/15/20 0814 03/13/20 0745         Psychosocial Re-Evaluation   Current issues with Current Stress Concerns Current Stress Concerns Current Stress Concerns     Comments Joe went on a work trip last week to [place] and freaked out a little getting out of car and through first leg.  After that, all was well and his confidence is starting to come back and improve. He  has been sleeping well.  He is feeling better and calmer now that he is back home again. Joe is getting better mentally.  His fear is starting to subside.  He has started to see a counselor and thinks it is beginning to help.  He is sleeping better, when the cat lets him. Joe continues to improve.  He feels that he is getting back to his normal self again.  Confidence is back up again.  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     Expected Outcomes Short: Get back into exercise routine for confidence build up  Long: Continue to improve mental outlook. Short: Exercise regularly for mental boost and continue to see counselor  Long; Continue to allow fear to dissapate. Short: Continue to exercise for mental boost.  Long: Continue to build confidence.     Interventions Stress management education;Encouraged to attend Cardiac Rehabilitation for the exercise Stress management education;Encouraged to attend Cardiac Rehabilitation for the exercise Stress management education;Encouraged to attend Cardiac Rehabilitation for the exercise     Continue Psychosocial Services  Follow up required by staff Follow up required by staff Follow up required by staff            Psychosocial Discharge (Final Psychosocial Re-Evaluation):  Psychosocial Re-Evaluation - 03/13/20 0745      Psychosocial Re-Evaluation   Current issues with Current Stress Concerns    Comments Joe continues to improve.  He feels that he is getting back to his normal self again.  Confidence is back up again.  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    Expected Outcomes Short: Continue to exercise for mental boost.  Long: Continue to build confidence.    Interventions Stress management education;Encouraged to attend Cardiac Rehabilitation for the exercise    Continue Psychosocial Services  Follow up required by staff           Vocational Rehabilitation: Provide vocational rehab assistance to qualifying  candidates.   Vocational Rehab Evaluation & Intervention:  Vocational Rehab - 01/04/20 1336      Initial Vocational Rehab Evaluation & Intervention   Assessment shows need for Vocational Rehabilitation No           Education: Education Goals: Education classes will be provided on a variety of topics geared toward better understanding of heart health and risk factor modification. Participant will state understanding/return demonstration of topics presented as noted by education test scores.  Learning Barriers/Preferences:  Learning Barriers/Preferences - 01/04/20 1336  Learning Barriers/Preferences   Learning Barriers None    Learning Preferences None           General Cardiac Education Topics:  AED/CPR: - Group verbal and written instruction with the use of models to demonstrate the basic use of the AED with the basic ABC's of resuscitation.   Anatomy & Physiology of the Heart: - Group verbal and written instruction and models provide basic cardiac anatomy and physiology, with the coronary electrical and arterial systems. Review of Valvular disease and Heart Failure   Cardiac Procedures: - Group verbal and written instruction to review commonly prescribed medications for heart disease. Reviews the medication, class of the drug, and side effects. Includes the steps to properly store meds and maintain the prescription regimen. (beta blockers and nitrates)   Cardiac Medications I: - Group verbal and written instruction to review commonly prescribed medications for heart disease. Reviews the medication, class of the drug, and side effects. Includes the steps to properly store meds and maintain the prescription regimen.   Cardiac Medications II: -Group verbal and written instruction to review commonly prescribed medications for heart disease. Reviews the medication, class of the drug, and side effects. (all other drug classes)    Go Sex-Intimacy & Heart Disease, Get  SMART - Goal Setting: - Group verbal and written instruction through game format to discuss heart disease and the return to sexual intimacy. Provides group verbal and written material to discuss and apply goal setting through the application of the S.M.A.R.T. Method.   Other Matters of the Heart: - Provides group verbal, written materials and models to describe Stable Angina and Peripheral Artery. Includes description of the disease process and treatment options available to the cardiac patient.   Infection Prevention: - Provides verbal and written material to individual with discussion of infection control including proper hand washing and proper equipment cleaning during exercise session.   Cardiac Rehab from 01/08/2020 in [facility] Cardiac and Pulmonary Rehab  Date 01/08/20  Educator [facility]  Instruction Review Code 1- Verbalizes Understanding      Falls Prevention: - Provides verbal and written material to individual with discussion of falls prevention and safety.   Cardiac Rehab from 01/08/2020 in [facility] Cardiac and Pulmonary Rehab  Date 01/08/20  Educator [facility]  Instruction Review Code 1- Verbalizes Understanding      Other: -Provides group and verbal instruction on various topics (see comments)   Knowledge Questionnaire Score:  Knowledge Questionnaire Score - 01/08/20 0925      Knowledge Questionnaire Score   Pre Score 25/26 Angina question only           Core Components/Risk Factors/Patient Goals at Admission:  Personal Goals and Risk Factors at Admission - 01/08/20 0924      Core Components/Risk Factors/Patient Goals on Admission    Weight Management Yes;Weight Maintenance    Intervention Weight Management: Develop a combined nutrition and exercise program designed to reach desired caloric intake, while maintaining appropriate intake of nutrient and fiber, sodium and fats, and appropriate energy expenditure required for the weight goal.;Weight Management: Provide education and  appropriate resources to help participant work on and attain dietary goals.    Admit Weight 168 lb 8 oz (76.4 kg)    Goal Weight: Short Term 168 lb (76.2 kg)    Goal Weight: Long Term 168 lb (76.2 kg)    Expected Outcomes Short Term: Continue to assess and modify interventions until short term weight is achieved;Long Term: Adherence to nutrition and physical activity/exercise program aimed  toward attainment of established weight goal;Weight Maintenance: Understanding of the daily nutrition guidelines, which includes 25-35% calories from fat, 7% or less cal from saturated fats, less than 231m cholesterol, less than 1.5gm of sodium, & 5 or more servings of fruits and vegetables daily    Diabetes Yes    Intervention Provide education about signs/symptoms and action to take for hypo/hyperglycemia.;Provide education about proper nutrition, including hydration, and aerobic/resistive exercise prescription along with prescribed medications to achieve blood glucose in normal ranges: Fasting glucose 65-99 mg/dL    Expected Outcomes Short Term: Participant verbalizes understanding of the signs/symptoms and immediate care of hyper/hypoglycemia, proper foot care and importance of medication, aerobic/resistive exercise and nutrition plan for blood glucose control.;Long Term: Attainment of HbA1C < 7%.    Lipids Yes    Intervention Provide education and support for participant on nutrition & aerobic/resistive exercise along with prescribed medications to achieve LDL <712m HDL >4072m   Expected Outcomes Short Term: Participant states understanding of desired cholesterol values and is compliant with medications prescribed. Participant is following exercise prescription and nutrition guidelines.;Long Term: Cholesterol controlled with medications as prescribed, with individualized exercise RX and with personalized nutrition plan. Value goals: LDL < 29m76mDL > 40 mg.           Education:Diabetes - Individual verbal  and written instruction to review signs/symptoms of diabetes, desired ranges of glucose level fasting, after meals and with exercise. Acknowledge that pre and post exercise glucose checks will be done for 3 sessions at entry of program.   [facility]  Date 01/04/20  Educator [facility] Code 1- VerbUnited States Steel Corporationerstanding      Education: Know Your Numbers and Risk Factors: -Group verbal and written instruction about important numbers in your health.  Discussion of what are risk factors and how they play a role in the disease process.  Review of Cholesterol, Blood Pressure, Diabetes, and BMI and the role they play in your overall health.   Core Components/Risk Factors/Patient Goals Review:   Goals and Risk Factor Review    Row Name 01/23/20 0751 02/15/20 0810 03/13/20 0747         Core Components/Risk Factors/Patient Goals Review   Personal Goals Review Weight Management/Obesity;Hypertension;Diabetes Weight Management/Obesity;Hypertension;Diabetes Weight Management/Obesity;Hypertension;Diabetes     Review Joe Wille Glaserdoing well in rehab. He has adjusted his eating habits some and his wait is down some.  Blood sugars have been steady for most part.  Blood pressures have been good in class and he will get back to it. Joe Wille Glaserdoing well. His weight is holding steady overall. He is trying to keep a close eye on it to make sure he is not losing.  Blood sugars are staying lower overall.  Blood pressures are doing well in class, he has not started checking at home but will get to keep a log. Overall he is getting in to a good routine. Joe Wille Glaserdoing well in rehab.  His sugars ran up a little high, but he changed out his reader and it leveled back out.  His pressures continue to do well in class.  He is still not checking at home.  Weight has been steady still.     Expected Outcomes Short: Get into habit of checking pressure at home  Long: Continue to  monitor risk factors. Short: Check pressure regularly Long; Continue to monitor risk factors. Short: Continue to keep close eye on sugars.  Long:  Continue to montior risk factors.            Core Components/Risk Factors/Patient Goals at Discharge (Final Review):   Goals and Risk Factor Review - 03/13/20 0747      Core Components/Risk Factors/Patient Goals Review   Personal Goals Review Weight Management/Obesity;Hypertension;Diabetes    Review Wille Glaser is doing well in rehab.  His sugars ran up a little high, but he changed out his reader and it leveled back out.  His pressures continue to do well in class.  He is still not checking at home.  Weight has been steady still.    Expected Outcomes Short: Continue to keep close eye on sugars.  Long: Continue to montior risk factors.           ITP Comments:  ITP Comments    Row Name 01/04/20 1344 01/08/20 0918 01/09/20 0847 01/10/20 0534 01/12/20 0958   ITP Comments Initial telephone orientation completed. Diagnosis can be found in [facility] 3/12. EP Orientation scheduled for 4/19 Completed 6MWT and gym orientation.  Initial ITP created and sent for review to Dr. Emily Filbert, Medical Director. First full day of exercise!  Patient was oriented to gym and equipment including functions, settings, policies, and procedures.  Patient's individual exercise prescription and treatment plan were reviewed.  All starting workloads were established based on the results of the 6 minute walk test done at initial orientation visit.  The plan for exercise progression was also introduced and progression will be customized based on patient's performance and goals. 30 Day review completed. Medical Director review done, changes made as directed,and approval shown by signature of Market researcher. New to program Completed Initial RD Eval   Row Name 02/07/20 0543 03/06/20 0549 04/03/20 0619       ITP Comments 30 Day review completed. ITP review done, changes made as directed,and  approval shown by signature of  Scientist, research (life sciences). 30 Day review completed. Medical Director ITP review done, changes made as directed, and signed approval by Medical Director. 30 Day review completed. Medical Director ITP review done, changes made as directed, and signed approval by Medical Director.            Comments:

## 2020-04-09 ENCOUNTER — Encounter: Payer: BLUE CROSS/BLUE SHIELD | Admitting: *Deleted

## 2020-04-09 ENCOUNTER — Other Ambulatory Visit: Payer: Self-pay

## 2020-04-09 DIAGNOSIS — I214 Non-ST elevation (NSTEMI) myocardial infarction: Secondary | ICD-10-CM

## 2020-04-09 DIAGNOSIS — I252 Old myocardial infarction: Secondary | ICD-10-CM | POA: Diagnosis not present

## 2020-04-09 NOTE — Progress Notes (Signed)
Daily Session Note  Patient Details  Name: Cassie Henkels MRN: [id number] Date of Birth: 10-28-1965 Referring Provider:     Cardiac Rehab from 01/08/2020 in [facility] Cardiac and Pulmonary Rehab  Referring Provider Bartholome Bill MD      Encounter Date: 04/09/2020  Check In:      Social History   Tobacco Use  Smoking Status Never Smoker  Smokeless Tobacco Never Used    Goals Met:  Independence with exercise equipment Exercise tolerated well No report of cardiac concerns or symptoms Strength training completed today  Goals Unmet:  Not Applicable  Comments: Pt able to follow exercise prescription today without complaint.  Will continue to monitor for progression.    Dr. Emily Filbert is Medical Director for [place] and [facility].

## 2020-04-09 NOTE — Progress Notes (Signed)
Daily Session Note  Patient Details  Name: Jamie Roach MRN: [id number] Date of Birth: [birth year] Referring Provider:     Cardiac Rehab from 01/08/2020 in [facility] Cardiac and Pulmonary Rehab  Referring Provider Bartholome Bill MD      Encounter Date: 04/09/2020  Check In:  Session Check In - 04/09/20 0740      Check-In   Supervising physician immediately available to respond to emergencies See telemetry face sheet for immediately available ER MD    Location [facility]-Cardiac & Pulmonary Rehab    Staff Present Nyoka Cowden, RN, BSN, Tyna Jaksch, MS Exercise Physiologist;Amanda Oletta Darter, [place], ACSM CEP, Exercise Physiologist    Virtual Visit No    Medication changes reported     No    Fall or balance concerns reported    No    Tobacco Cessation No Change    Warm-up and Cool-down Performed on first and last piece of equipment    Resistance Training Performed Yes    VAD Patient? No    PAD/SET Patient? No      Pain Assessment   Currently in Pain? No/denies              Social History   Tobacco Use  Smoking Status Never Smoker  Smokeless Tobacco Never Used    Goals Met:  Independence with exercise equipment Exercise tolerated well No report of cardiac concerns or symptoms Strength training completed today  Goals Unmet:  Not Applicable  Comments: Pt able to follow exercise prescription today without complaint.  Will continue to monitor for progression.    Dr. Emily Filbert is Medical Director for [place] and [facility].

## 2020-04-11 ENCOUNTER — Other Ambulatory Visit: Payer: Self-pay

## 2020-04-11 ENCOUNTER — Encounter: Payer: BLUE CROSS/BLUE SHIELD | Admitting: *Deleted

## 2020-04-11 DIAGNOSIS — I252 Old myocardial infarction: Secondary | ICD-10-CM | POA: Diagnosis not present

## 2020-04-11 DIAGNOSIS — I214 Non-ST elevation (NSTEMI) myocardial infarction: Secondary | ICD-10-CM

## 2020-04-11 DIAGNOSIS — Z955 Presence of coronary angioplasty implant and graft: Secondary | ICD-10-CM

## 2020-04-11 NOTE — Progress Notes (Signed)
Daily Session Note  Patient Details  Name: Jamie Roach MRN: [id number] Date of Birth: 11/20/1965 Referring Provider:     Cardiac Rehab from 01/08/2020 in [facility] Cardiac and Pulmonary Rehab  Referring Provider Bartholome Bill MD      Encounter Date: 04/11/2020  Check In:  Session Check In - 04/11/20 1937      Check-In   Supervising physician immediately available to respond to emergencies See telemetry face sheet for immediately available ER MD    Location [facility]-Cardiac & Pulmonary Rehab    Staff Present Heath Lark, RN, BSN, CCRP;Jessica Thermal, MA, RCEP, CCRP, [place], [place], ACSM CEP, Exercise Physiologist    Virtual Visit No    Medication changes reported     No    Fall or balance concerns reported    No    Warm-up and Cool-down Performed on first and last piece of equipment    Resistance Training Performed Yes    VAD Patient? No    PAD/SET Patient? No      Pain Assessment   Currently in Pain? No/denies              Social History   Tobacco Use  Smoking Status Never Smoker  Smokeless Tobacco Never Used    Goals Met:  Independence with exercise equipment Exercise tolerated well No report of cardiac concerns or symptoms  Goals Unmet:  Not Applicable  Comments: Pt able to follow exercise prescription today without complaint.  Will continue to monitor for progression.    Dr. Emily Filbert is Medical Director for [place] and [facility].

## 2020-04-16 ENCOUNTER — Encounter: Payer: BLUE CROSS/BLUE SHIELD | Admitting: *Deleted

## 2020-04-16 ENCOUNTER — Other Ambulatory Visit: Payer: Self-pay

## 2020-04-16 VITALS — Ht 69.75 in | Wt 166.8 lb

## 2020-04-16 DIAGNOSIS — I252 Old myocardial infarction: Secondary | ICD-10-CM | POA: Diagnosis not present

## 2020-04-16 DIAGNOSIS — I214 Non-ST elevation (NSTEMI) myocardial infarction: Secondary | ICD-10-CM

## 2020-04-16 DIAGNOSIS — Z955 Presence of coronary angioplasty implant and graft: Secondary | ICD-10-CM

## 2020-04-16 NOTE — Progress Notes (Signed)
Daily Session Note  Patient Details  Name: Kallin Henk MRN: [id number] Date of Birth: 1966/02/06 Referring Provider:     Cardiac Rehab from 01/08/2020 in [facility] Cardiac and Pulmonary Rehab  Referring Provider Bartholome Bill MD      Encounter Date: 04/16/2020  Check In:  Session Check In - 04/16/20 0814      Check-In   Supervising physician immediately available to respond to emergencies See telemetry face sheet for immediately available ER MD    Location [facility]-Cardiac & Pulmonary Rehab    Staff Present Heath Lark, RN, BSN, CCRP;Melissa Glendon RDN, Rowe Pavy, BA, ACSM CEP, Exercise Physiologist    Virtual Visit No    Medication changes reported     No    Fall or balance concerns reported    No    Warm-up and Cool-down Performed on first and last piece of equipment    Resistance Training Performed Yes    VAD Patient? No    PAD/SET Patient? No      Pain Assessment   Currently in Pain? No/denies              Social History   Tobacco Use  Smoking Status Never Smoker  Smokeless Tobacco Never Used    Goals Met:  Independence with exercise equipment Exercise tolerated well No report of cardiac concerns or symptoms  Goals Unmet:  Not Applicable  Comments: Pt able to follow exercise prescription today without complaint.  Will continue to monitor for progression.    Dr. Emily Filbert is Medical Director for [place] and [facility].

## 2020-04-18 ENCOUNTER — Other Ambulatory Visit: Payer: Self-pay

## 2020-04-18 ENCOUNTER — Encounter: Payer: BLUE CROSS/BLUE SHIELD | Admitting: *Deleted

## 2020-04-18 DIAGNOSIS — I214 Non-ST elevation (NSTEMI) myocardial infarction: Secondary | ICD-10-CM

## 2020-04-18 DIAGNOSIS — I252 Old myocardial infarction: Secondary | ICD-10-CM | POA: Diagnosis not present

## 2020-04-18 NOTE — Progress Notes (Signed)
Daily Session Note  Patient Details  Name: Jamie Roach MRN: [id number] Date of Birth: [birth year] Referring Provider:     Cardiac Rehab from 01/08/2020 in [facility] Cardiac and Pulmonary Rehab  Referring Provider Bartholome Bill MD      Encounter Date: 04/18/2020  Check In:  Session Check In - 04/18/20 0749      Check-In   Supervising physician immediately available to respond to emergencies See telemetry face sheet for immediately available ER MD    Location [facility]-Cardiac & Pulmonary Rehab    Staff Present Heath Lark, RN, BSN, CCRP;Melissa [place] RDN, Rowe Pavy, BA, ACSM CEP, Exercise Physiologist    Virtual Visit No    Medication changes reported     No    Fall or balance concerns reported    No    Warm-up and Cool-down Performed on first and last piece of equipment    Resistance Training Performed Yes    VAD Patient? No    PAD/SET Patient? No      Pain Assessment   Currently in Pain? No/denies              Social History   Tobacco Use  Smoking Status Never Smoker  Smokeless Tobacco Never Used    Goals Met:  Independence with exercise equipment Exercise tolerated well No report of cardiac concerns or symptoms  Goals Unmet:  Not Applicable  Comments: Pt able to follow exercise prescription today without complaint.  Will continue to monitor for progression.    Dr. Emily Filbert is Medical Director for [place] and [facility].

## 2020-04-23 ENCOUNTER — Other Ambulatory Visit: Payer: Self-pay

## 2020-04-23 ENCOUNTER — Encounter: Payer: BLUE CROSS/BLUE SHIELD | Attending: Cardiology | Admitting: *Deleted

## 2020-04-23 DIAGNOSIS — Z955 Presence of coronary angioplasty implant and graft: Secondary | ICD-10-CM | POA: Insufficient documentation

## 2020-04-23 DIAGNOSIS — I214 Non-ST elevation (NSTEMI) myocardial infarction: Secondary | ICD-10-CM

## 2020-04-23 DIAGNOSIS — I252 Old myocardial infarction: Secondary | ICD-10-CM | POA: Diagnosis not present

## 2020-04-23 NOTE — Progress Notes (Signed)
Daily Session Note  Patient Details  Name: Jamie Roach MRN: [id number] Date of Birth: 08-04-1966 Referring Provider:     Cardiac Rehab from 01/08/2020 in [facility]  Referring Provider Bartholome Bill MD      Encounter Date: 04/23/2020  Check In:  Session Check In - 04/23/20 0815      Check-In   Supervising physician immediately available to respond to emergencies See telemetry face sheet for immediately available ER MD    Location [facility]iac & Pul[facility] Re[facility]    Staff Present Heath Lark, RN, BSN, Jacklynn Bue, MS Exercise Physiologist;Amanda Oletta Darter, [place], ACSM CEP, Exercise Physiologist    Virtual Visit No    Medication changes reported     No    Fall or balance concerns reported    No    Warm-up and Cool-down Performed on first and last piece of equipment    Resistance Training Performed Yes    VAD Patient? No    PAD/SET Patient? No      Pain Assessment   Currently in Pain? No/denies              Social History   Tobacco Use  Smoking Status Never Smoker  Smokeless Tobacco Never Used    Goals Met:  Independence with exercise equipment Exercise tolerated well No report of cardiac concerns or symptoms  Goals Unmet:  Not Applicable  Comments: Pt able to follow exercise prescription today without complaint.  Will continue to monitor for progression.    Dr. Emily Filbert is Medical Director for [facility] and [facility].

## 2020-04-25 ENCOUNTER — Encounter: Payer: BLUE CROSS/BLUE SHIELD | Admitting: *Deleted

## 2020-04-25 ENCOUNTER — Other Ambulatory Visit: Payer: Self-pay

## 2020-04-25 DIAGNOSIS — Z955 Presence of coronary angioplasty implant and graft: Secondary | ICD-10-CM

## 2020-04-25 DIAGNOSIS — I252 Old myocardial infarction: Secondary | ICD-10-CM | POA: Diagnosis not present

## 2020-04-25 DIAGNOSIS — I214 Non-ST elevation (NSTEMI) myocardial infarction: Secondary | ICD-10-CM

## 2020-04-25 NOTE — Progress Notes (Signed)
Daily Session Note  Patient Details  Name: Forest Redwine MRN: [id number] Date of Birth: 03-03-1966 Referring Provider:     Cardiac Rehab from 01/08/2020 in [facility]  Referring Provider Bartholome Bill MD      Encounter Date: 04/25/2020  Check In:  Session Check In - 04/25/20 0846      Check-In   Supervising physician immediately available to respond to emergencies See telemetry face sheet for immediately available ER MD    Location [facility]    Staff Present Heath Lark, RN, BSN, CCRP;Melissa [place] RDN, Rowe Pavy, BA, ACSM CEP, Exercise Physiologist    Virtual Visit No    Medication changes reported     No    Fall or balance concerns reported    No    Resistance Training Performed Yes    VAD Patient? No    PAD/SET Patient? No      Pain Assessment   Currently in Pain? No/denies              Social History   Tobacco Use  Smoking Status Never Smoker  Smokeless Tobacco Never Used    Goals Met:  Independence with exercise equipment Exercise tolerated well No report of cardiac concerns or symptoms  Goals Unmet:  Not Applicable  Comments: Pt able to follow exercise prescription today without complaint.  Will continue to monitor for progression.    Dr. Emily Filbert is Medical Director for [facility].

## 2020-04-30 ENCOUNTER — Encounter: Payer: BLUE CROSS/BLUE SHIELD | Admitting: *Deleted

## 2020-04-30 ENCOUNTER — Other Ambulatory Visit: Payer: Self-pay

## 2020-04-30 DIAGNOSIS — I214 Non-ST elevation (NSTEMI) myocardial infarction: Secondary | ICD-10-CM

## 2020-04-30 DIAGNOSIS — I252 Old myocardial infarction: Secondary | ICD-10-CM | POA: Diagnosis not present

## 2020-04-30 DIAGNOSIS — Z955 Presence of coronary angioplasty implant and graft: Secondary | ICD-10-CM

## 2020-04-30 NOTE — Progress Notes (Signed)
Daily Session Note  Patient Details  Name: Jamie Roach MRN: [id number] Date of Birth: 03/01/1966 Referring Provider:     Cardiac Rehab from 01/08/2020 in [facility]  Referring Provider Fath, Kenneth MD      Encounter Date: 04/30/2020  Check In:  Session Check In - 04/30/20 0814      Check-In   Supervising physician immediately available to respond to emergencies See telemetry face sheet for immediately available ER MD    Location [facility]    Staff Present Susanne Bice, RN, BSN, CCRP;Amanda Sommer, BA, ACSM CEP, Exercise Physiologist;Kara Langdon, MS Exercise Physiologist    Virtual Visit No    Medication changes reported     No    Fall or balance concerns reported    No    Warm-up and Cool-down Performed on first and last piece of equipment    Resistance Training Performed Yes    VAD Patient? No    PAD/SET Patient? No      Pain Assessment   Currently in Pain? No/denies              Social History   Tobacco Use  Smoking Status Never Smoker  Smokeless Tobacco Never Used    Goals Met:  Independence with exercise equipment Exercise tolerated well No report of cardiac concerns or symptoms  Goals Unmet:  Not Applicable  Comments: Pt able to follow exercise prescription today without complaint.  Will continue to monitor for progression.    Dr. Mark Miller is Medical Director for [facility] and [facility]. 

## 2020-04-30 NOTE — Patient Instructions (Signed)
Discharge Patient Instructions  Patient Details  Name: Jamie Roach MRN: [id number] Date of Birth: 28-Jan-1966 Referring Provider:  Teodoro Spray, MD   Number of Visits: 30  Reason for Discharge:  Patient reached a stable level of exercise. Patient independent in their exercise. Patient has met program and personal goals.  Smoking History:  Social History   Tobacco Use  Smoking Status Never Smoker  Smokeless Tobacco Never Used    Diagnosis:  NSTEMI (non-ST elevated myocardial infarction) ([place])  Status post coronary artery stent placement  Initial Exercise Prescription:  Initial Exercise Prescription - 01/08/20 0900      Date of Initial Exercise RX and Referring Provider   Date 01/08/20    Referring Provider Bartholome Bill MD      Treadmill   MPH 2.6    Grade 1    Minutes 15    METs 3.3      Elliptical   Level 1    Speed 4.2    Minutes 15    METs 3      REL-XR   Level 3    Speed 50    Minutes 15    METs 3      T5 Nustep   Level 3    SPM 80    Minutes 15    METs 3      Prescription Details   Frequency (times per week) 2    Duration Progress to 30 minutes of continuous aerobic without signs/symptoms of physical distress      Intensity   THRR 40-80% of Max Heartrate 107-148    Ratings of Perceived Exertion 11-13    Perceived Dyspnea 0-4      Progression   Progression Continue to progress workloads to maintain intensity without signs/symptoms of physical distress.      Resistance Training   Training Prescription Yes    Weight 4 lb    Reps 10-15           Discharge Exercise Prescription (Final Exercise Prescription Changes):  Exercise Prescription Changes - 04/17/20 1300      Response to Exercise   Blood Pressure (Admit) 122/62    Blood Pressure (Exercise) 134/70    Blood Pressure (Exit) 120/64    Heart Rate (Admit) 79 bpm    Heart Rate (Exercise) 165 bpm    Heart Rate (Exit) 100 bpm    Rating of Perceived Exertion  (Exercise) 13    Symptoms none    Duration Continue with 30 min of aerobic exercise without signs/symptoms of physical distress.    Intensity THRR unchanged      Progression   Progression Continue to progress workloads to maintain intensity without signs/symptoms of physical distress.    Average METs 6.8      Resistance Training   Training Prescription Yes    Weight  8 lb    Reps 10-15      Interval Training   Interval Training Yes      Treadmill   MPH 3.7    Grade 8    Minutes 15    METs 7.91      Elliptical   Level 8    Minutes 15    METs 5.5           Functional Capacity:  6 Minute Walk    Row Name 01/08/20 0918 04/16/20 0825       6 Minute Walk   Phase Initial Discharge    Distance 1400 feet 1785 feet  Distance % Change -- 27.5 %    Distance Feet Change -- 385 ft    Walk Time 6 minutes 6 minutes    # of Rest Breaks 0 0    MPH 2.65 3.38    METS 4.24 5.05    RPE 7 11    VO2 Peak 14.83 17.66    Symptoms No No    Resting HR 67 bpm 78 bpm    Resting BP 142/74  very nervous 122/62    Resting Oxygen Saturation  99 % --    Exercise Oxygen Saturation  during 6 min walk 98 % --    Max Ex. HR 93 bpm 104 bpm    Max Ex. BP 132/74 134/70    2 Minute Post BP 128/74 --           Nutrition & Weight - Outcomes:  Pre Biometrics - 01/08/20 0924      Pre Biometrics   Height 5' 9.75" (1.772 m)    Weight 168 lb 8 oz (76.4 kg)    BMI (Calculated) 24.34    Single Leg Stand 30 seconds           Post Biometrics - 04/16/20 0826       Post  Biometrics   Height 5' 9.75" (1.772 m)    Weight 166 lb 12.8 oz (75.7 kg)    BMI (Calculated) 24.1           Nutrition:  Nutrition Therapy & Goals - 01/12/20 0951      Nutrition Therapy   Diet Low Na, Heart Healthy    Protein (specify units) 65-70g    Fiber 30 grams    Whole Grain Foods 3 servings    Saturated Fats 12 max. grams    Fruits and Vegetables 5 servings/day    Sodium 1.5 grams      Personal  Nutrition Goals   Nutrition Goal ST: increase variety LT: feel sercure to exercise outside of rehab    Comments Pt having smoothies BID with a meal of chicken and vegetables for dinner. Smoothies include supplements such as vitamin D, greens supplement, other greens, walnuts, berries, and collegen protein powder as well as pea protein. Discussed heart healthy eating, sufficient protein, variety, microbiome, satiety, honoring hunger, social aspects of food, and answered pt and pt wife questions.      Intervention Plan   Intervention Prescribe, educate and counsel regarding individualized specific dietary modifications aiming towards targeted core components such as weight, hypertension, lipid management, diabetes, heart failure and other comorbidities.;Nutrition handout(s) given to patient.    Expected Outcomes Short Term Goal: Understand basic principles of dietary content, such as calories, fat, sodium, cholesterol and nutrients.;Short Term Goal: A plan has been developed with personal nutrition goals set during dietitian appointment.;Long Term Goal: Adherence to prescribed nutrition plan.           Goals reviewed with patient; copy given to patient.

## 2020-05-01 ENCOUNTER — Encounter: Payer: Self-pay | Admitting: *Deleted

## 2020-05-01 DIAGNOSIS — Z955 Presence of coronary angioplasty implant and graft: Secondary | ICD-10-CM

## 2020-05-01 DIAGNOSIS — I214 Non-ST elevation (NSTEMI) myocardial infarction: Secondary | ICD-10-CM

## 2020-05-01 NOTE — Progress Notes (Signed)
Cardiac Individual Treatment Plan  Patient Details  Name: Jamie Roach MRN: [id number] Date of Birth: November 21, 1965 Referring Provider:     Cardiac Rehab from 01/08/2020 in [facility] Cardiac and Pulmonary Rehab  Referring Provider Bartholome Bill MD      Initial Encounter Date:    Cardiac Rehab from 01/08/2020 in [facility] Cardiac and Pulmonary Rehab  Date 01/08/20      Visit Diagnosis: NSTEMI (non-ST elevated myocardial infarction) [facility])  Status post coronary artery stent placement  Patient's Home Medications on Admission:  Current Outpatient Medications:  .  aspirin 81 MG EC tablet, TAKE 1 TABLET(81 MG) BY MOUTH DAILY, Disp: , Rfl:  .  atorvastatin (LIPITOR) 80 MG tablet, Take 1 tablet (80 mg total) by mouth daily at 6 PM., Disp: 30 tablet, Rfl: 0 .  atorvastatin (LIPITOR) 80 MG tablet, TAKE 1 TABLET(80 MG) BY MOUTH DAILY AT 6 PM, Disp: , Rfl:  .  Cholecalciferol 25 MCG (1000 UT) capsule, Take by mouth., Disp: , Rfl:  .  Continuous Blood Gluc Receiver (FREESTYLE LIBRE 14 DAY READER) DEVI, Use 1 kit as directed E10.649, Disp: , Rfl:  .  Lancets (FREESTYLE) lancets, USE TO CHECK BLOOD SUGAR 6 TIMES A DAY, Disp: , Rfl:  .  metoprolol tartrate (LOPRESSOR) 25 MG tablet, Take 0.5 tablets (12.5 mg total) by mouth 2 (two) times daily., Disp: 30 tablet, Rfl: 0 .  Multiple Vitamin (MULTI-VITAMIN) tablet, Take by mouth., Disp: , Rfl:  .  NOVOLOG 100 UNIT/ML injection, SMARTSIG:75 Unit(s) SUB-Q Daily, Disp: , Rfl:  .  prasugrel (EFFIENT) 10 MG TABS tablet, Take by mouth., Disp: , Rfl:   Past Medical History: Past Medical History:  Diagnosis Date  . Type 1 diabetes (HCC)     Tobacco Use: Social History   Tobacco Use  Smoking Status Never Smoker  Smokeless Tobacco Never Used    Labs: Recent Review Scientist, physiological    Labs for ITP Cardiac and Pulmonary Rehab Latest Ref Rng & Units 12/01/2019   Cholestrol 0 - 200 mg/dL 193   LDLCALC 0 - 99 mg/dL 140(H)   HDL >40 mg/dL 49    Trlycerides <150 mg/dL 22   Hemoglobin A1c 4.8 - 5.6 % 6.7(H)       Exercise Target Goals: Exercise Program Goal: Individual exercise prescription set using results from initial 6 min walk test and THRR while considering  patient's activity barriers and safety.   Exercise Prescription Goal: Initial exercise prescription builds to 30-45 minutes a day of aerobic activity, 2-3 days per week.  Home exercise guidelines will be given to patient during program as part of exercise prescription that the participant will acknowledge.   Education: Aerobic Exercise & Resistance Training: - Gives group verbal and written instruction on the various components of exercise. Focuses on aerobic and resistive training programs and the benefits of this training and how to safely progress through these programs..   Education: Exercise & Equipment Safety: - Individual verbal instruction and demonstration of equipment use and safety with use of the equipment.   Cardiac Rehab from 04/25/2020 in [facility] Cardiac and Pulmonary Rehab  Date 01/08/20  Educator [facility]  Instruction Review Code 1- Verbalizes Understanding      Education: Exercise Physiology & General Exercise Guidelines: - Group verbal and written instruction with models to review the exercise physiology of the cardiovascular system and associated critical values. Provides general exercise guidelines with specific guidelines to those with heart or lung disease.    Education: Flexibility, Balance,  Mind/Body Relaxation: Provides group verbal/written instruction on the benefits of flexibility and balance training, including mind/body exercise modes such as yoga, pilates and tai chi.  Demonstration and skill practice provided.   Activity Barriers & Risk Stratification:  Activity Barriers & Cardiac Risk Stratification - 01/08/20 0919      Activity Barriers & Cardiac Risk Stratification   Activity Barriers Other (comment)    Comments very nervous and scared  of exertion    Cardiac Risk Stratification Moderate           6 Minute Walk:  6 Minute Walk    Row Name 01/08/20 0918 04/16/20 0825       6 Minute Walk   Phase Initial Discharge    Distance 1400 feet 1785 feet    Distance % Change -- 27.5 %    Distance Feet Change -- 385 ft    Walk Time 6 minutes 6 minutes    # of Rest Breaks 0 0    MPH 2.65 3.38    METS 4.24 5.05    RPE 7 11    VO2 Peak 14.83 17.66    Symptoms No No    Resting HR 67 bpm 78 bpm    Resting BP 142/74  very nervous 122/62    Resting Oxygen Saturation  99 % --    Exercise Oxygen Saturation  during 6 min walk 98 % --    Max Ex. HR 93 bpm 104 bpm    Max Ex. BP 132/74 134/70    2 Minute Post BP 128/74 --           Oxygen Initial Assessment:   Oxygen Re-Evaluation:   Oxygen Discharge (Final Oxygen Re-Evaluation):   Initial Exercise Prescription:  Initial Exercise Prescription - 01/08/20 0900      Date of Initial Exercise RX and Referring Provider   Date 01/08/20    Referring Provider Bartholome Bill MD      Treadmill   MPH 2.6    Grade 1    Minutes 15    METs 3.3      Elliptical   Level 1    Speed 4.2    Minutes 15    METs 3      REL-XR   Level 3    Speed 50    Minutes 15    METs 3      T5 Nustep   Level 3    SPM 80    Minutes 15    METs 3      Prescription Details   Frequency (times per week) 2    Duration Progress to 30 minutes of continuous aerobic without signs/symptoms of physical distress      Intensity   THRR 40-80% of Max Heartrate 107-148    Ratings of Perceived Exertion 11-13    Perceived Dyspnea 0-4      Progression   Progression Continue to progress workloads to maintain intensity without signs/symptoms of physical distress.      Resistance Training   Training Prescription Yes    Weight 4 lb    Reps 10-15           Perform Capillary Blood Glucose checks as needed.  Exercise Prescription Changes:  Exercise Prescription Changes    Row Name 01/08/20  0900 01/10/20 1100 01/15/20 1300 01/23/20 1500 02/05/20 1600     Response to Exercise   Blood Pressure (Admit) 142/74 132/60 -- 124/66 114/60   Blood Pressure (Exercise) 132/74 134/62 -- 138/58 154/56  Blood Pressure (Exit) 128/74 112/64 -- 102/52 112/64   Heart Rate (Admit) 67 bpm 87 bpm -- 87 bpm 91 bpm   Heart Rate (Exercise) 93 bpm 108 bpm -- 103 bpm 147 bpm   Heart Rate (Exit) 65 bpm 69 bpm -- 72 bpm 99 bpm   Oxygen Saturation (Admit) 99 % -- -- -- --   Oxygen Saturation (Exercise) 98 % -- -- -- --   Rating of Perceived Exertion (Exercise) 7 11 -- 11 14   Symptoms none, very nervours and scared none -- none none   Comments walk test results first full day of exercise -- -- --   Duration -- Continue with 30 min of aerobic exercise without signs/symptoms of physical distress. -- Continue with 30 min of aerobic exercise without signs/symptoms of physical distress. Continue with 30 min of aerobic exercise without signs/symptoms of physical distress.   Intensity -- THRR unchanged -- THRR unchanged THRR unchanged     Progression   Progression -- Continue to progress workloads to maintain intensity without signs/symptoms of physical distress. -- Continue to progress workloads to maintain intensity without signs/symptoms of physical distress. Continue to progress workloads to maintain intensity without signs/symptoms of physical distress.   Average METs -- 2.98 -- 3.35 5.49     Resistance Training   Training Prescription -- Yes -- Yes Yes   Weight -- 4 lb -- 4 lb 4 lb   Reps -- 10-15 -- 10-15 10-15     Interval Training   Interval Training -- No -- No No     Treadmill   MPH -- 2.6 -- 2.6 2.6   Grade -- 1 -- 1 3   Minutes -- 15 -- 15 15   METs -- 3.35 -- 3.36 4.07     Elliptical   Level -- -- -- -- 1   Speed -- -- -- -- 4.2   Minutes -- -- -- -- 15     REL-XR   Level -- -- -- 4 4   Speed -- -- -- 50 --   Minutes -- -- -- 15 15   METs -- -- -- -- 6.9     T5 Nustep   Level  -- 3 -- -- --   Minutes -- 15 -- -- --   METs -- 2.6 -- -- --     Home Exercise Plan   Plans to continue exercise at -- -- Home (comment)  walking Home (comment)  walking Home (comment)  walking   Frequency -- -- Add 3 additional days to program exercise sessions. Add 3 additional days to program exercise sessions. Add 3 additional days to program exercise sessions.   Initial Home Exercises Provided -- -- 01/15/20 01/15/20 01/15/20   Row Name 02/22/20 0900 03/07/20 1400 03/19/20 1400 04/01/20 1400 04/17/20 1300     Response to Exercise   Blood Pressure (Admit) 108/64 104/54 128/64 132/70 122/62   Blood Pressure (Exercise) 144/70 142/68 154/60 152/62 134/70   Blood Pressure (Exit) 124/64 104/62 106/54 100/56 120/64   Heart Rate (Admit) 106 bpm 73 bpm 80 bpm 88 bpm 79 bpm   Heart Rate (Exercise) 152 bpm 124 bpm 136 bpm 164 bpm 165 bpm   Heart Rate (Exit) 93 bpm 84 bpm 115 bpm 103 bpm 100 bpm   Rating of Perceived Exertion (Exercise) 13 13 12 13 13    Symptoms none none none none none   Duration Continue with 30 min of aerobic exercise without signs/symptoms of physical distress. Continue  with 30 min of aerobic exercise without signs/symptoms of physical distress. Continue with 30 min of aerobic exercise without signs/symptoms of physical distress. Continue with 30 min of aerobic exercise without signs/symptoms of physical distress. Continue with 30 min of aerobic exercise without signs/symptoms of physical distress.   Intensity THRR unchanged THRR unchanged THRR unchanged THRR unchanged THRR unchanged     Progression   Progression Continue to progress workloads to maintain intensity without signs/symptoms of physical distress. Continue to progress workloads to maintain intensity without signs/symptoms of physical distress. Continue to progress workloads to maintain intensity without signs/symptoms of physical distress. Continue to progress workloads to maintain intensity without signs/symptoms of  physical distress. Continue to progress workloads to maintain intensity without signs/symptoms of physical distress.   Average METs 6.25 7.33 6.99 9 6.8     Resistance Training   Training Prescription Yes Yes Yes Yes Yes   Weight 5 lb 8 lb 8 lb 8 lb  8 lb   Reps 10-15 10-15 10-15 10-15 10-15     Interval Training   Interval Training No No No Yes Yes   Equipment -- -- -- Treadmill;Elliptical;REL-XR --   Comments -- -- -- 1 min on 1 min off --     Treadmill   MPH 3.8 3.7 3.7 3.7 3.7   Grade 4.5 5 7 7 8    Minutes 15 15 15 15 15    METs 6.27 6.53 7.4 7.4 7.91     Elliptical   Level 4 8 8  7.5 8   Speed -- 2.5 2.5 5 --   Minutes 15 15 15 15 15    METs -- 4.9 6.57 -- 5.5     REL-XR   Level -- 10 10 10  --   Minutes -- 15 15 15  --   METs -- 10.9 8.9 10.6 --     T5 Nustep   Level -- 4 4 -- --   Minutes -- 15 15 -- --   METs -- -- 6.3 -- --     Home Exercise Plan   Plans to continue exercise at -- Home (comment)  walking Home (comment)  walking Home (comment)  walking --   Frequency -- Add 3 additional days to program exercise sessions. Add 3 additional days to program exercise sessions. Add 3 additional days to program exercise sessions. --   Initial Home Exercises Provided -- 01/15/20 01/15/20 01/15/20 --          Exercise Comments:  Exercise Comments    Row Name 01/09/20 0848           Exercise Comments First full day of exercise!  Patient was oriented to gym and equipment including functions, settings, policies, and procedures.  Patient's individual exercise prescription and treatment plan were reviewed.  All starting workloads were established based on the results of the 6 minute walk test done at initial orientation visit.  The plan for exercise progression was also introduced and progression will be customized based on patient's performance and goals.              Exercise Goals and Review:  Exercise Goals    Row Name 01/08/20 0923             Exercise Goals    Increase Physical Activity Yes       Intervention Provide advice, education, support and counseling about physical activity/exercise needs.;Develop an individualized exercise prescription for aerobic and resistive training based on initial evaluation findings, risk stratification, comorbidities and participant's personal  goals.       Expected Outcomes Short Term: Attend rehab on a regular basis to increase amount of physical activity.;Long Term: Add in home exercise to make exercise part of routine and to increase amount of physical activity.;Long Term: Exercising regularly at least 3-5 days a week.       Increase Strength and Stamina Yes       Intervention Provide advice, education, support and counseling about physical activity/exercise needs.;Develop an individualized exercise prescription for aerobic and resistive training based on initial evaluation findings, risk stratification, comorbidities and participant's personal goals.       Expected Outcomes Short Term: Increase workloads from initial exercise prescription for resistance, speed, and METs.;Short Term: Perform resistance training exercises routinely during rehab and add in resistance training at home;Long Term: Improve cardiorespiratory fitness, muscular endurance and strength as measured by increased METs and functional capacity (6MWT)       Able to understand and use rate of perceived exertion (RPE) scale Yes       Intervention Provide education and explanation on how to use RPE scale       Expected Outcomes Short Term: Able to use RPE daily in rehab to express subjective intensity level;Long Term:  Able to use RPE to guide intensity level when exercising independently       Able to understand and use Dyspnea scale Yes       Intervention Provide education and explanation on how to use Dyspnea scale       Expected Outcomes Short Term: Able to use Dyspnea scale daily in rehab to express subjective sense of shortness of breath during  exertion;Long Term: Able to use Dyspnea scale to guide intensity level when exercising independently       Knowledge and understanding of Target Heart Rate Range (THRR) Yes       Intervention Provide education and explanation of THRR including how the numbers were predicted and where they are located for reference       Expected Outcomes Short Term: Able to state/look up THRR;Short Term: Able to use daily as guideline for intensity in rehab;Long Term: Able to use THRR to govern intensity when exercising independently       Able to check pulse independently Yes       Intervention Provide education and demonstration on how to check pulse in carotid and radial arteries.;Review the importance of being able to check your own pulse for safety during independent exercise       Expected Outcomes Short Term: Able to explain why pulse checking is important during independent exercise;Long Term: Able to check pulse independently and accurately       Understanding of Exercise Prescription Yes       Intervention Provide education, explanation, and written materials on patient's individual exercise prescription       Expected Outcomes Long Term: Able to explain home exercise prescription to exercise independently;Short Term: Able to explain program exercise prescription              Exercise Goals Re-Evaluation :  Exercise Goals Re-Evaluation    Row Name 01/09/20 0851 01/15/20 1305 01/23/20 0745 01/23/20 1550 02/05/20 1556     Exercise Goal Re-Evaluation   Exercise Goals Review Able to understand and use rate of perceived exertion (RPE) scale;Knowledge and understanding of Target Heart Rate Range (THRR);Understanding of Exercise Prescription Increase Physical Activity;Increase Strength and Stamina;Able to understand and use rate of perceived exertion (RPE) scale;Able to understand and use Dyspnea scale;Knowledge and understanding  of Target Heart Rate Range (THRR);Able to check pulse independently;Understanding  of Exercise Prescription Increase Physical Activity;Increase Strength and Stamina;Understanding of Exercise Prescription Increase Physical Activity;Increase Strength and Stamina;Able to understand and use rate of perceived exertion (RPE) scale;Able to understand and use Dyspnea scale;Knowledge and understanding of Target Heart Rate Range (THRR);Able to check pulse independently;Understanding of Exercise Prescription Increase Physical Activity;Increase Strength and Stamina;Understanding of Exercise Prescription   Comments Reviewed RPE and dyspnea scales, THR and program prescription with pt today.  Pt voiced understanding and was given a copy of goals to take home. Reviewed home exercise with pt today.  Pt plans to continue to walk at home for exercise.  Also talked about using staff videos as well for exercise.  Reviewed THR, pulse, RPE, sign and symptoms, NTG use, and when to call 911 or MD.  Also discussed weather considerations and indoor options.  Pt voiced understanding. Joe was out the end of last week for a work trip.  He did not exercise while he was gone but plans to get back to it now that they are back.  He is starting to get back his strength and stamina and regaining condfidence too. -- Wille Glaser is doing well in rehab.  He is now back to driving again. He even said that he liked the elliptical and enjoys coming to class!  He is up to 6.9 METs on XR.  We will continue to monitor his progress.   Expected Outcomes Short: Use RPE daily to regulate intensity. Long: Follow program prescription in THR. Short: Start to add in exercise at home on off days from rehab.  Long: Continue to improve stamina and confidence Short: Continue to gain confidence in heart again  Long: Continue to improve stamina and get back to normal routine -- Short: Continue to improve on elliptical Long: Continue to get back to normal life again.   [place] Name 02/15/20 1610 02/22/20 0915 03/07/20 1414 03/13/20 0744 03/19/20 1436      Exercise Goal Re-Evaluation   Exercise Goals Review Increase Physical Activity;Increase Strength and Stamina;Understanding of Exercise Prescription Increase Physical Activity;Increase Strength and Stamina;Understanding of Exercise Prescription Increase Physical Activity;Increase Strength and Stamina;Understanding of Exercise Prescription Increase Physical Activity;Increase Strength and Stamina;Understanding of Exercise Prescription Increase Physical Activity;Increase Strength and Stamina;Understanding of Exercise Prescription   Comments Wille Glaser is doing well in rehab.  He walks at work but not much else.  He has a bike that he is going to start to use.  He has been doing a lot of yard work.  His stamina is getting better but he is still very tired in the evening and has some flutters.  We talked about talking to the doc about it. Joe has made great progress and increased workloads and overall MET level.  He has also moved up on weights strength training. Wille Glaser has been doing well in rehab.  He doing well on the ellipitcal and with intervals.  He is now on level 10 for the XR.  We will continue to monitor his progress. Wille Glaser is doing well in rehab.  His confidence is getting better in his ability to exercise again.  He is walking on his off days at home.  He is feeling stronger and has more stamina.  They are going to the beach this weekend. Joe continues to to yield good results in rehab and is able to exercise independently outside of the program.   Expected Outcomes Short: Talk to doc abotu flutters and add in more  exercise  Long: Continue to build stamina back up. Short: continue to attend consistently Long:  continue to progress workloads Short: Continue with intervals  Long; Continue to improve stamina and confidence Short: Continue with intervals  Long; Continue to improve stamina and confidence Short: Continue to walk on his off days Long: Increase endurance and strength, maintain independent exercise at home in  appropriate HR ranges   Row Name 04/01/20 1432 04/09/20 0734 04/11/20 0723 04/17/20 1317       Exercise Goal Re-Evaluation   Exercise Goals Review Increase Physical Activity;Increase Strength and Stamina;Understanding of Exercise Prescription Increase Physical Activity;Increase Strength and Stamina;Understanding of Exercise Prescription Increase Physical Activity;Increase Strength and Stamina;Understanding of Exercise Prescription Increase Physical Activity;Increase Strength and Stamina;Understanding of Exercise Prescription    Comments Joe continues to do well in rehab. He is out on vacation visiting family this week.  He was very excited to be going and having the confidence to go again.  He is up to level 10 on the XR with intervals.  He is also at 7% grade on the treadmill.  We will continue to montior his progress. Wille Glaser is doing well. He continutes to walk 2-3 days/ 15-20 minutes, outside. Acknowledges when he should not exercise outside due to weather. Plans to go to [facility] after his completion at rehab -- Joe improved his post 6MWT by 27.5 % !  He has made great progess in the program    Expected Outcomes Short: Contineu to use intervals and exercise on vacation Long: Continue to exercise independently with more confidence Short: Increase exercise duration to about 30 minutes/session. Long: Continue to improve overall workloads and MET level -- Short: complete HT program Long: maintain exercise indepedently           Discharge Exercise Prescription (Final Exercise Prescription Changes):  Exercise Prescription Changes - 04/17/20 1300      Response to Exercise   Blood Pressure (Admit) 122/62    Blood Pressure (Exercise) 134/70    Blood Pressure (Exit) 120/64    Heart Rate (Admit) 79 bpm    Heart Rate (Exercise) 165 bpm    Heart Rate (Exit) 100 bpm    Rating of Perceived Exertion (Exercise) 13    Symptoms none    Duration Continue with 30 min of aerobic exercise without signs/symptoms of  physical distress.    Intensity THRR unchanged      Progression   Progression Continue to progress workloads to maintain intensity without signs/symptoms of physical distress.    Average METs 6.8      Resistance Training   Training Prescription Yes    Weight  8 lb    Reps 10-15      Interval Training   Interval Training Yes      Treadmill   MPH 3.7    Grade 8    Minutes 15    METs 7.91      Elliptical   Level 8    Minutes 15    METs 5.5           Nutrition:  Target Goals: Understanding of nutrition guidelines, daily intake of sodium <1531m, cholesterol <2090m calories 30% from fat and 7% or less from saturated fats, daily to have 5 or more servings of fruits and vegetables.  Education: Controlling Sodium/Reading Food Labels -Group verbal and written material supporting the discussion of sodium use in heart healthy nutrition. Review and explanation with models, verbal and written materials for utilization of the food label.  Education: General Nutrition Guidelines/Fats and Fiber: -Group instruction provided by verbal, written material, models and posters to present the general guidelines for heart healthy nutrition. Gives an explanation and review of dietary fats and fiber.   Biometrics:  Pre Biometrics - 01/08/20 0924      Pre Biometrics   Height 5' 9.75" (1.772 m)    Weight 168 lb 8 oz (76.4 kg)    BMI (Calculated) 24.34    Single Leg Stand 30 seconds           Post Biometrics - 04/16/20 0826       Post  Biometrics   Height 5' 9.75" (1.772 m)    Weight 166 lb 12.8 oz (75.7 kg)    BMI (Calculated) 24.1           Nutrition Therapy Plan and Nutrition Goals:  Nutrition Therapy & Goals - 01/12/20 0951      Nutrition Therapy   Diet Low Na, Heart Healthy    Protein (specify units) 65-70g    Fiber 30 grams    Whole Grain Foods 3 servings    Saturated Fats 12 max. grams    Fruits and Vegetables 5 servings/day    Sodium 1.5 grams      Personal  Nutrition Goals   Nutrition Goal ST: increase variety LT: feel sercure to exercise outside of rehab    Comments Pt having smoothies BID with a meal of chicken and vegetables for dinner. Smoothies include supplements such as vitamin D, greens supplement, other greens, walnuts, berries, and collegen protein powder as well as pea protein. Discussed heart healthy eating, sufficient protein, variety, microbiome, satiety, honoring hunger, social aspects of food, and answered pt and pt wife questions.      Intervention Plan   Intervention Prescribe, educate and counsel regarding individualized specific dietary modifications aiming towards targeted core components such as weight, hypertension, lipid management, diabetes, heart failure and other comorbidities.;Nutrition handout(s) given to patient.    Expected Outcomes Short Term Goal: Understand basic principles of dietary content, such as calories, fat, sodium, cholesterol and nutrients.;Short Term Goal: A plan has been developed with personal nutrition goals set during dietitian appointment.;Long Term Goal: Adherence to prescribed nutrition plan.           Nutrition Assessments:  Nutrition Assessments - 04/25/20 0958      MEDFICTS Scores   Pre Score 30    Post Score 9    Score Difference -21           MEDIFICTS Score Key:          ?70 Need to make dietary changes          40-70 Heart Healthy Diet         ? 40 Therapeutic Level Cholesterol Diet  Nutrition Goals Re-Evaluation:  Nutrition Goals Re-Evaluation    [place] Name 03/13/20 0816 04/09/20 0748           Goals   Nutrition Goal ST: increase variety LT: feel sercure to exercise outside of rehab ST: increase variety LT: feel sercure to exercise outside of rehab      Comment Joe continues to make improvements to his diet.  He is getting more comfortable with trying new things and adding to his diet. --      Expected Outcome Short: Continue to increase variety  Long: Continue to eat heart  healthy. --             Nutrition Goals Discharge (Final Nutrition Goals Re-Evaluation):  Nutrition Goals Re-Evaluation - 04/09/20 0748      Goals   Nutrition Goal ST: increase variety LT: feel sercure to exercise outside of rehab           Psychosocial: Target Goals: Acknowledge presence or absence of significant depression and/or stress, maximize coping skills, provide positive support system. Participant is able to verbalize types and ability to use techniques and skills needed for reducing stress and depression.   Education: Depression - Provides group verbal and written instruction on the correlation between heart/lung disease and depressed mood, treatment options, and the stigmas associated with seeking treatment.   Cardiac Rehab from 04/25/2020 in [facility] Cardiac and Pulmonary Rehab  Date 04/25/20  Educator [facility]  Instruction Review Code 1- Verbalizes Understanding      Education: Sleep Hygiene -Provides group verbal and written instruction about how sleep can affect your health.  Define sleep hygiene, discuss sleep cycles and impact of sleep habits. Review good sleep hygiene tips.     Education: Stress and Anxiety: - Provides group verbal and written instruction about the health risks of elevated stress and causes of high stress.  Discuss the correlation between heart/lung disease and anxiety and treatment options. Review healthy ways to manage with stress and anxiety.   Cardiac Rehab from 04/25/2020 in [facility] Cardiac and Pulmonary Rehab  Date 04/25/20  Educator [facility]  Instruction Review Code 1- Verbalizes Understanding       Initial Review & Psychosocial Screening:  Initial Psych Review & Screening - 01/04/20 1345      Initial Review   Current issues with Current Stress Concerns    Comments post MI and stent      Family Dynamics   Good Support System? Yes      Barriers   Psychosocial barriers to participate in program There are no identifiable barriers or psychosocial  needs.;The patient should benefit from training in stress management and relaxation.      Screening Interventions   Interventions To provide support and resources with identified psychosocial needs    Expected Outcomes Short Term goal: Utilizing psychosocial counselor, staff and physician to assist with identification of specific Stressors or current issues interfering with healing process. Setting desired goal for each stressor or current issue identified.;Long Term Goal: Stressors or current issues are controlled or eliminated.;Short Term goal: Identification and review with participant of any Quality of Life or Depression concerns found by scoring the questionnaire.;Long Term goal: The participant improves quality of Life and PHQ9 Scores as seen by post scores and/or verbalization of changes           Quality of Life Scores:   Quality of Life - 04/25/20 0958      Quality of Life Scores   Health/Function Pre 26.57 %    Health/Function Post 28.63 %    Health/Function % Change 7.75 %    Socioeconomic Pre 30 %    Socioeconomic Post 30 %    Socioeconomic % Change  0 %    Psych/Spiritual Pre 28.29 %    Psych/Spiritual Post 29.14 %    Psych/Spiritual % Change 3 %    Family Pre 25.5 %    Family Post 28.5 %    Family % Change 11.76 %    GLOBAL Pre 27.56 %    GLOBAL Post 29.02 %    GLOBAL % Change 5.3 %          Scores of 19 and below usually indicate a poorer quality of life in these  areas.  A difference of  2-3 points is a clinically meaningful difference.  A difference of 2-3 points in the total score of the Quality of Life Index has been associated with significant improvement in overall quality of life, self-image, physical symptoms, and general health in studies assessing change in quality of life.  PHQ-9: Recent Review Flowsheet Data    Depression screen [facility] 2/9 04/25/2020 01/08/2020   Decreased Interest 0 0   Down, Depressed, Hopeless 0 0   PHQ - 2 Score 0 0   Altered sleeping  0 0   Tired, decreased energy 0 1   Change in appetite 0 0   Feeling bad or failure about yourself  0 1   Trouble concentrating 0 0   Moving slowly or fidgety/restless 0 0   Suicidal thoughts 0 0   PHQ-9 Score 0 2   Difficult doing work/chores Not difficult at all Not difficult at all     Interpretation of Total Score  Total Score Depression Severity:  1-4 = Minimal depression, 5-9 = Mild depression, 10-14 = Moderate depression, 15-19 = Moderately severe depression, 20-27 = Severe depression   Psychosocial Evaluation and Intervention:  Psychosocial Evaluation - 01/04/20 1346      Psychosocial Evaluation & Interventions   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 He is nervous about starting exercise, but also says he knows he needs to do something.    Expected Outcomes Short: attend [facility] for exercise and educaiton. Long: develop positive self care habits    Continue Psychosocial Services  Follow up required by staff           Psychosocial Re-Evaluation:  Psychosocial Re-Evaluation    [place] Name 01/23/20 [phone] 02/15/20 0814 03/13/20 0745 04/09/20 0744       Psychosocial Re-Evaluation   Current issues with Current Stress Concerns Current Stress Concerns Current Stress Concerns Current Stress Concerns    Comments Joe went on a work trip last week to [place] and freaked out a little getting out of car and through first leg.  After that, all was well and his confidence is starting to come back and improve. He has been sleeping well.  He is feeling better and calmer now that he is back home again. Joe is getting better mentally.  His fear is starting to subside.  He has started to see a counselor and thinks it is beginning to help.  He is sleeping better, when the cat lets him. Joe continues to improve.  He feels that he is getting back to his normal self  again.  Confidence is back up again.  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 Joe says he has been sleeping really well, denies any complaints with sleeping. He sees his counselor twice per month. Increasing confidence over time, will continue to see his counselor on a regular basis and says it is helping him tremendously. Denies any other big stressors.    Expected Outcomes Short: Get back into exercise routine for confidence build up  Long: Continue to improve mental outlook. Short: Exercise regularly for mental boost and continue to see counselor  Long; Continue to allow fear to dissapate. Short: Continue to exercise for mental boost.  Long: Continue to build confidence. Short: Continue to use exercise for positive attitude, stress management Long: Maintain positive attitude, maintain confidence  Interventions Stress management education;Encouraged to attend Cardiac Rehabilitation for the exercise Stress management education;Encouraged to attend Cardiac Rehabilitation for the exercise Stress management education;Encouraged to attend Cardiac Rehabilitation for the exercise Stress management education;Encouraged to attend Cardiac Rehabilitation for the exercise    Continue Psychosocial Services  Follow up required by staff Follow up required by staff Follow up required by staff Follow up required by staff           Psychosocial Discharge (Final Psychosocial Re-Evaluation):  Psychosocial Re-Evaluation - 04/09/20 0744      Psychosocial Re-Evaluation   Current issues with Current Stress Concerns    Comments Wille Glaser says he has been sleeping really well, denies any complaints with sleeping. He sees his counselor twice per month. Increasing confidence over time, will continue to see his counselor on a regular basis and says it is helping him tremendously. Denies any other big stressors.    Expected Outcomes Short: Continue to use exercise for positive attitude, stress management Long:  Maintain positive attitude, maintain confidence    Interventions Stress management education;Encouraged to attend Cardiac Rehabilitation for the exercise    Continue Psychosocial Services  Follow up required by staff           Vocational Rehabilitation: Provide vocational rehab assistance to qualifying candidates.   Vocational Rehab Evaluation & Intervention:  Vocational Rehab - 01/04/20 1336      Initial Vocational Rehab Evaluation & Intervention   Assessment shows need for Vocational Rehabilitation No           Education: Education Goals: Education classes will be provided on a variety of topics geared toward better understanding of heart health and risk factor modification. Participant will state understanding/return demonstration of topics presented as noted by education test scores.  Learning Barriers/Preferences:  Learning Barriers/Preferences - 01/04/20 1336      Learning Barriers/Preferences   Learning Barriers None    Learning Preferences None           General Cardiac Education Topics:  AED/CPR: - Group verbal and written instruction with the use of models to demonstrate the basic use of the AED with the basic ABC's of resuscitation.   Anatomy & Physiology of the Heart: - Group verbal and written instruction and models provide basic cardiac anatomy and physiology, with the coronary electrical and arterial systems. Review of Valvular disease and Heart Failure   Cardiac Procedures: - Group verbal and written instruction to review commonly prescribed medications for heart disease. Reviews the medication, class of the drug, and side effects. Includes the steps to properly store meds and maintain the prescription regimen. (beta blockers and nitrates)   Cardiac Medications I: - Group verbal and written instruction to review commonly prescribed medications for heart disease. Reviews the medication, class of the drug, and side effects. Includes the steps to  properly store meds and maintain the prescription regimen.   Cardiac Rehab from 04/25/2020 in [facility] Cardiac and Pulmonary Rehab  Date 04/18/20  Educator sb  Instruction Review Code 1- Verbalizes Understanding      Cardiac Medications II: -Group verbal and written instruction to review commonly prescribed medications for heart disease. Reviews the medication, class of the drug, and side effects. (all other drug classes)    Go Sex-Intimacy & Heart Disease, Get SMART - Goal Setting: - Group verbal and written instruction through game format to discuss heart disease and the return to sexual intimacy. Provides group verbal and written material to discuss and apply goal setting through the application of  the S.M.A.R.T. Method.   Other Matters of the Heart: - Provides group verbal, written materials and models to describe Stable Angina and Peripheral Artery. Includes description of the disease process and treatment options available to the cardiac patient.   Infection Prevention: - Provides verbal and written material to individual with discussion of infection control including proper hand washing and proper equipment cleaning during exercise session.   Cardiac Rehab from 04/25/2020 in [facility] Cardiac and Pulmonary Rehab  Date 01/08/20  Educator [facility]  Instruction Review Code 1- Verbalizes Understanding      Falls Prevention: - Provides verbal and written material to individual with discussion of falls prevention and safety.   Cardiac Rehab from 04/25/2020 in [facility] Cardiac and Pulmonary Rehab  Date 01/08/20  Educator [facility]  Instruction Review Code 1- Verbalizes Understanding      Other: -Provides group and verbal instruction on various topics (see comments)   Knowledge Questionnaire Score:  Knowledge Questionnaire Score - 04/25/20 0958      Knowledge Questionnaire Score   Pre Score 25/26 Angina question only    Post Score 26/26           Core Components/Risk Factors/Patient Goals at  Admission:  Personal Goals and Risk Factors at Admission - 01/08/20 0924      Core Components/Risk Factors/Patient Goals on Admission    Weight Management Yes;Weight Maintenance    Intervention Weight Management: Develop a combined nutrition and exercise program designed to reach desired caloric intake, while maintaining appropriate intake of nutrient and fiber, sodium and fats, and appropriate energy expenditure required for the weight goal.;Weight Management: Provide education and appropriate resources to help participant work on and attain dietary goals.    Admit Weight 168 lb 8 oz (76.4 kg)    Goal Weight: Short Term 168 lb (76.2 kg)    Goal Weight: Long Term 168 lb (76.2 kg)    Expected Outcomes Short Term: Continue to assess and modify interventions until short term weight is achieved;Long Term: Adherence to nutrition and physical activity/exercise program aimed toward attainment of established weight goal;Weight Maintenance: Understanding of the daily nutrition guidelines, which includes 25-35% calories from fat, 7% or less cal from saturated fats, less than 219m cholesterol, less than 1.5gm of sodium, & 5 or more servings of fruits and vegetables daily    Diabetes Yes    Intervention Provide education about signs/symptoms and action to take for hypo/hyperglycemia.;Provide education about proper nutrition, including hydration, and aerobic/resistive exercise prescription along with prescribed medications to achieve blood glucose in normal ranges: Fasting glucose 65-99 mg/dL    Expected Outcomes Short Term: Participant verbalizes understanding of the signs/symptoms and immediate care of hyper/hypoglycemia, proper foot care and importance of medication, aerobic/resistive exercise and nutrition plan for blood glucose control.;Long Term: Attainment of HbA1C < 7%.    Lipids Yes    Intervention Provide education and support for participant on nutrition & aerobic/resistive exercise along with  prescribed medications to achieve LDL <759m HDL >4069m   Expected Outcomes Short Term: Participant states understanding of desired cholesterol values and is compliant with medications prescribed. Participant is following exercise prescription and nutrition guidelines.;Long Term: Cholesterol controlled with medications as prescribed, with individualized exercise RX and with personalized nutrition plan. Value goals: LDL < 61m29mDL > 40 mg.           Education:Diabetes - Individual verbal and written instruction to review signs/symptoms of diabetes, desired ranges of glucose level fasting, after meals and with exercise. Acknowledge that pre and  post exercise glucose checks will be done for 3 sessions at entry of program.   Cardiac Rehab from 04/25/2020 in [facility] Cardiac and Pulmonary Rehab  Date 01/04/20  Educator [facility]  Instruction Review Code 1- [employer] Understanding      Education: Know Your Numbers and Risk Factors: -Group verbal and written instruction about important numbers in your health.  Discussion of what are risk factors and how they play a role in the disease process.  Review of Cholesterol, Blood Pressure, Diabetes, and BMI and the role they play in your overall health.   Core Components/Risk Factors/Patient Goals Review:   Goals and Risk Factor Review    Row Name 01/23/20 0751 02/15/20 0810 03/13/20 0747 04/09/20 0738       Core Components/Risk Factors/Patient Goals Review   Personal Goals Review Weight Management/Obesity;Hypertension;Diabetes Weight Management/Obesity;Hypertension;Diabetes Weight Management/Obesity;Hypertension;Diabetes Weight Management/Obesity;Hypertension;Diabetes    Review Wille Glaser is doing well in rehab. He has adjusted his eating habits some and his wait is down some.  Blood sugars have been steady for most part.  Blood pressures have been good in class and he will get back to it. Wille Glaser is doing well. His weight is holding steady overall. He is trying to keep a  close eye on it to make sure he is not losing.  Blood sugars are staying lower overall.  Blood pressures are doing well in class, he has not started checking at home but will get to keep a log. Overall he is getting in to a good routine. Wille Glaser is doing well in rehab.  His sugars ran up a little high, but he changed out his reader and it leveled back out.  His pressures continue to do well in class.  He is still not checking at home.  Weight has been steady still. Joe just came back from vacation and reported he had a couple high sugars on vacation, however, most have normalized out at home. He is checking his sugars at home minimum 3x/day. BP here at rehab have been great, does not check BP at home. Weight has been steady.    Expected Outcomes Short: Get into habit of checking pressure at home  Long: Continue to monitor risk factors. Short: Check pressure regularly Long; Continue to monitor risk factors. Short: Continue to keep close eye on sugars.  Long: Continue to montior risk factors. Short: Continue to check blood sugars at home, take BP at home with monitor  Long: Continue to manage lifestyle factors           Core Components/Risk Factors/Patient Goals at Discharge (Final Review):   Goals and Risk Factor Review - 04/09/20 0738      Core Components/Risk Factors/Patient Goals Review   Personal Goals Review Weight Management/Obesity;Hypertension;Diabetes    Review Joe just came back from vacation and reported he had a couple high sugars on vacation, however, most have normalized out at home. He is checking his sugars at home minimum 3x/day. BP here at rehab have been great, does not check BP at home. Weight has been steady.    Expected Outcomes Short: Continue to check blood sugars at home, take BP at home with monitor  Long: Continue to manage lifestyle factors           ITP Comments:  ITP Comments    Row Name 01/04/20 1344 01/08/20 0918 01/09/20 0847 01/10/20 0534 01/12/20 0958   ITP  Comments Initial telephone orientation completed. Diagnosis can be found in [facility] 3/12. EP Orientation scheduled for 4/19  Completed 6MWT and gym orientation.  Initial ITP created and sent for review to Dr. Emily Filbert, Medical Director. First full day of exercise!  Patient was oriented to gym and equipment including functions, settings, policies, and procedures.  Patient's individual exercise prescription and treatment plan were reviewed.  All starting workloads were established based on the results of the 6 minute walk test done at initial orientation visit.  The plan for exercise progression was also introduced and progression will be customized based on patient's performance and goals. 30 Day review completed. Medical Director review done, changes made as directed,and approval shown by signature of Market researcher. New to program Completed Initial RD Eval   Row Name 02/07/20 0543 03/06/20 0549 04/03/20 0619 05/01/20 0556     ITP Comments 30 Day review completed. ITP review done, changes made as directed,and approval shown by signature of  Scientist, research (life sciences). 30 Day review completed. Medical Director ITP review done, changes made as directed, and signed approval by Medical Director. 30 Day review completed. Medical Director ITP review done, changes made as directed, and signed approval by Medical Director. 30 Day review completed. Medical Director ITP review done, changes made as directed, and signed approval by Medical Director.           Comments:

## 2020-05-02 ENCOUNTER — Encounter: Payer: BLUE CROSS/BLUE SHIELD | Admitting: *Deleted

## 2020-05-02 ENCOUNTER — Other Ambulatory Visit: Payer: Self-pay

## 2020-05-02 DIAGNOSIS — Z955 Presence of coronary angioplasty implant and graft: Secondary | ICD-10-CM

## 2020-05-02 DIAGNOSIS — I214 Non-ST elevation (NSTEMI) myocardial infarction: Secondary | ICD-10-CM

## 2020-05-02 DIAGNOSIS — I252 Old myocardial infarction: Secondary | ICD-10-CM | POA: Diagnosis not present

## 2020-05-02 NOTE — Progress Notes (Signed)
Daily Session Note  Patient Details  Name: Jamie Roach MRN: [id number]  Date of Birth: 1965/11/08 Referring Provider:     Cardiac Rehab from 01/08/2020 in [facility] Cardiac and Pulmonary Rehab  Referring Provider Bartholome Bill MD      Encounter Date: 05/02/2020  Check In:  Session Check In - 05/02/20 0807      Check-In   Supervising physician immediately available to respond to emergencies See telemetry face sheet for immediately available ER MD    Location [facility]-Cardiac & Pulmonary Rehab    Staff Present Heath Lark, RN, BSN, CCRP;Melissa Mastic RDN, Rowe Pavy, BA, ACSM CEP, Exercise Physiologist    Virtual Visit No    Medication changes reported     No    Fall or balance concerns reported    No    Warm-up and Cool-down Performed on first and last piece of equipment    Resistance Training Performed Yes    VAD Patient? No    PAD/SET Patient? No      Pain Assessment   Currently in Pain? No/denies              Social History   Tobacco Use  Smoking Status Never Smoker  Smokeless Tobacco Never Used    Goals Met:  Independence with exercise equipment Exercise tolerated well Personal goals reviewed No report of cardiac concerns or symptoms  Goals Unmet:  Not Applicable  Comments:  Jamie Roach graduated today from  rehab with 36 sessions completed.  Details of the patient's exercise prescription and what He needs to do in order to continue the prescription and progress were discussed with patient.  Patient was given a copy of prescription and goals.  Patient verbalized understanding.  Jamie Roach plans to continue to exercise by exercising at home.    Dr. Emily Filbert is Medical Director for [place] [facility].

## 2020-05-02 NOTE — Progress Notes (Signed)
Cardiac Individual Treatment Plan  Patient Details  Name: Jamie Roach MRN: [id number] Date of Birth: 1966-05-09 Referring Provider:     Cardiac Rehab from 01/08/2020 in [facility] Cardiac and Pulmonary Rehab  Referring Provider Bartholome Bill MD      Initial Encounter Date:    Cardiac Rehab from 01/08/2020 in [facility] Cardiac and Pulmonary Rehab  Date 01/08/20      Visit Diagnosis: NSTEMI (non-ST elevated myocardial infarction) [facility])  Status post coronary artery stent placement  Patient's Home Medications on Admission:  Current Outpatient Medications:  .  aspirin 81 MG EC tablet, TAKE 1 TABLET(81 MG) BY MOUTH DAILY, Disp: , Rfl:  .  atorvastatin (LIPITOR) 80 MG tablet, Take 1 tablet (80 mg total) by mouth daily at 6 PM., Disp: 30 tablet, Rfl: 0 .  atorvastatin (LIPITOR) 80 MG tablet, TAKE 1 TABLET(80 MG) BY MOUTH DAILY AT 6 PM, Disp: , Rfl:  .  Cholecalciferol 25 MCG (1000 UT) capsule, Take by mouth., Disp: , Rfl:  .  Continuous Blood Gluc Receiver (FREESTYLE LIBRE 14 DAY READER) DEVI, Use 1 kit as directed E10.649, Disp: , Rfl:  .  Lancets (FREESTYLE) lancets, USE TO CHECK BLOOD SUGAR 6 TIMES A DAY, Disp: , Rfl:  .  metoprolol tartrate (LOPRESSOR) 25 MG tablet, Take 0.5 tablets (12.5 mg total) by mouth 2 (two) times daily., Disp: 30 tablet, Rfl: 0 .  Multiple Vitamin (MULTI-VITAMIN) tablet, Take by mouth., Disp: , Rfl:  .  NOVOLOG 100 UNIT/ML injection, SMARTSIG:75 Unit(s) SUB-Q Daily, Disp: , Rfl:  .  prasugrel (EFFIENT) 10 MG TABS tablet, Take by mouth., Disp: , Rfl:   Past Medical History: Past Medical History:  Diagnosis Date  . Type 1 diabetes (HCC)     Tobacco Use: Social History   Tobacco Use  Smoking Status Never Smoker  Smokeless Tobacco Never Used    Labs: Recent Review Scientist, physiological    Labs for ITP Cardiac and Pulmonary Rehab Latest Ref Rng & Units 12/01/2019   Cholestrol 0 - 200 mg/dL 193   LDLCALC 0 - 99 mg/dL 140(H)   HDL >40 mg/dL 49    Trlycerides <150 mg/dL 22   Hemoglobin A1c 4.8 - 5.6 % 6.7(H)       Exercise Target Goals: Exercise Program Goal: Individual exercise prescription set using results from initial 6 min walk test and THRR while considering  patient's activity barriers and safety.   Exercise Prescription Goal: Initial exercise prescription builds to 30-45 minutes a day of aerobic activity, 2-3 days per week.  Home exercise guidelines will be given to patient during program as part of exercise prescription that the participant will acknowledge.   Education: Aerobic Exercise & Resistance Training: - Gives group verbal and written instruction on the various components of exercise. Focuses on aerobic and resistive training programs and the benefits of this training and how to safely progress through these programs..   Education: Exercise & Equipment Safety: - Individual verbal instruction and demonstration of equipment use and safety with use of the equipment.   Cardiac Rehab from 05/02/2020 in [facility] Cardiac and Pulmonary Rehab  Date 01/08/20  Educator [facility]  Instruction Review Code 1- Verbalizes Understanding      Education: Exercise Physiology & General Exercise Guidelines: - Group verbal and written instruction with models to review the exercise physiology of the cardiovascular system and associated critical values. Provides general exercise guidelines with specific guidelines to those with heart or lung disease.    Education: Flexibility, Balance,  Mind/Body Relaxation: Provides group verbal/written instruction on the benefits of flexibility and balance training, including mind/body exercise modes such as yoga, pilates and tai chi.  Demonstration and skill practice provided.   Activity Barriers & Risk Stratification:  Activity Barriers & Cardiac Risk Stratification - 01/08/20 0919      Activity Barriers & Cardiac Risk Stratification   Activity Barriers Other (comment)    Comments very nervous and  scared of exertion    Cardiac Risk Stratification Moderate           6 Minute Walk:  6 Minute Walk    Row Name 01/08/20 0918 04/16/20 0825       6 Minute Walk   Phase Initial Discharge    Distance 1400 feet 1785 feet    Distance % Change -- 27.5 %    Distance Feet Change -- 385 ft    Walk Time 6 minutes 6 minutes    # of Rest Breaks 0 0    MPH 2.65 3.38    METS 4.24 5.05    RPE 7 11    VO2 Peak 14.83 17.66    Symptoms No No    Resting HR 67 bpm 78 bpm    Resting BP 142/74  very nervous 122/62    Resting Oxygen Saturation  99 % --    Exercise Oxygen Saturation  during 6 min walk 98 % --    Max Ex. HR 93 bpm 104 bpm    Max Ex. BP 132/74 134/70    2 Minute Post BP 128/74 --           Oxygen Initial Assessment:   Oxygen Re-Evaluation:   Oxygen Discharge (Final Oxygen Re-Evaluation):   Initial Exercise Prescription:  Initial Exercise Prescription - 01/08/20 0900      Date of Initial Exercise RX and Referring Provider   Date 01/08/20    Referring Provider Bartholome Bill MD      Treadmill   MPH 2.6    Grade 1    Minutes 15    METs 3.3      Elliptical   Level 1    Speed 4.2    Minutes 15    METs 3      REL-XR   Level 3    Speed 50    Minutes 15    METs 3      T5 Nustep   Level 3    SPM 80    Minutes 15    METs 3      Prescription Details   Frequency (times per week) 2    Duration Progress to 30 minutes of continuous aerobic without signs/symptoms of physical distress      Intensity   THRR 40-80% of Max Heartrate 107-148    Ratings of Perceived Exertion 11-13    Perceived Dyspnea 0-4      Progression   Progression Continue to progress workloads to maintain intensity without signs/symptoms of physical distress.      Resistance Training   Training Prescription Yes    Weight 4 lb    Reps 10-15           Perform Capillary Blood Glucose checks as needed.  Exercise Prescription Changes:  Exercise Prescription Changes    Row Name  01/08/20 0900 01/10/20 1100 01/15/20 1300 01/23/20 1500 02/05/20 1600     Response to Exercise   Blood Pressure (Admit) 142/74 132/60 -- 124/66 114/60   Blood Pressure (Exercise) 132/74 134/62 -- 138/58 154/56  Blood Pressure (Exit) 128/74 112/64 -- 102/52 112/64   Heart Rate (Admit) 67 bpm 87 bpm -- 87 bpm 91 bpm   Heart Rate (Exercise) 93 bpm 108 bpm -- 103 bpm 147 bpm   Heart Rate (Exit) 65 bpm 69 bpm -- 72 bpm 99 bpm   Oxygen Saturation (Admit) 99 % -- -- -- --   Oxygen Saturation (Exercise) 98 % -- -- -- --   Rating of Perceived Exertion (Exercise) 7 11 -- 11 14   Symptoms none, very nervours and scared none -- none none   Comments walk test results first full day of exercise -- -- --   Duration -- Continue with 30 min of aerobic exercise without signs/symptoms of physical distress. -- Continue with 30 min of aerobic exercise without signs/symptoms of physical distress. Continue with 30 min of aerobic exercise without signs/symptoms of physical distress.   Intensity -- THRR unchanged -- THRR unchanged THRR unchanged     Progression   Progression -- Continue to progress workloads to maintain intensity without signs/symptoms of physical distress. -- Continue to progress workloads to maintain intensity without signs/symptoms of physical distress. Continue to progress workloads to maintain intensity without signs/symptoms of physical distress.   Average METs -- 2.98 -- 3.35 5.49     Resistance Training   Training Prescription -- Yes -- Yes Yes   Weight -- 4 lb -- 4 lb 4 lb   Reps -- 10-15 -- 10-15 10-15     Interval Training   Interval Training -- No -- No No     Treadmill   MPH -- 2.6 -- 2.6 2.6   Grade -- 1 -- 1 3   Minutes -- 15 -- 15 15   METs -- 3.35 -- 3.36 4.07     Elliptical   Level -- -- -- -- 1   Speed -- -- -- -- 4.2   Minutes -- -- -- -- 15     REL-XR   Level -- -- -- 4 4   Speed -- -- -- 50 --   Minutes -- -- -- 15 15   METs -- -- -- -- 6.9     T5 Nustep    Level -- 3 -- -- --   Minutes -- 15 -- -- --   METs -- 2.6 -- -- --     Home Exercise Plan   Plans to continue exercise at -- -- Home (comment)  walking Home (comment)  walking Home (comment)  walking   Frequency -- -- Add 3 additional days to program exercise sessions. Add 3 additional days to program exercise sessions. Add 3 additional days to program exercise sessions.   Initial Home Exercises Provided -- -- 01/15/20 01/15/20 01/15/20   Row Name 02/22/20 0900 03/07/20 1400 03/19/20 1400 04/01/20 1400 04/17/20 1300     Response to Exercise   Blood Pressure (Admit) 108/64 104/54 128/64 132/70 122/62   Blood Pressure (Exercise) 144/70 142/68 154/60 152/62 134/70   Blood Pressure (Exit) 124/64 104/62 106/54 100/56 120/64   Heart Rate (Admit) 106 bpm 73 bpm 80 bpm 88 bpm 79 bpm   Heart Rate (Exercise) 152 bpm 124 bpm 136 bpm 164 bpm 165 bpm   Heart Rate (Exit) 93 bpm 84 bpm 115 bpm 103 bpm 100 bpm   Rating of Perceived Exertion (Exercise) 13 13 12 13 13    Symptoms none none none none none   Duration Continue with 30 min of aerobic exercise without signs/symptoms of physical distress. Continue  with 30 min of aerobic exercise without signs/symptoms of physical distress. Continue with 30 min of aerobic exercise without signs/symptoms of physical distress. Continue with 30 min of aerobic exercise without signs/symptoms of physical distress. Continue with 30 min of aerobic exercise without signs/symptoms of physical distress.   Intensity THRR unchanged THRR unchanged THRR unchanged THRR unchanged THRR unchanged     Progression   Progression Continue to progress workloads to maintain intensity without signs/symptoms of physical distress. Continue to progress workloads to maintain intensity without signs/symptoms of physical distress. Continue to progress workloads to maintain intensity without signs/symptoms of physical distress. Continue to progress workloads to maintain intensity without  signs/symptoms of physical distress. Continue to progress workloads to maintain intensity without signs/symptoms of physical distress.   Average METs 6.25 7.33 6.99 9 6.8     Resistance Training   Training Prescription Yes Yes Yes Yes Yes   Weight 5 lb 8 lb 8 lb 8 lb  8 lb   Reps 10-15 10-15 10-15 10-15 10-15     Interval Training   Interval Training No No No Yes Yes   Equipment -- -- -- Treadmill;Elliptical;REL-XR --   Comments -- -- -- 1 min on 1 min off --     Treadmill   MPH 3.8 3.7 3.7 3.7 3.7   Grade 4.5 5 7 7 8    Minutes 15 15 15 15 15    METs 6.27 6.53 7.4 7.4 7.91     Elliptical   Level 4 8 8  7.5 8   Speed -- 2.5 2.5 5 --   Minutes 15 15 15 15 15    METs -- 4.9 6.57 -- 5.5     REL-XR   Level -- 10 10 10  --   Minutes -- 15 15 15  --   METs -- 10.9 8.9 10.6 --     T5 Nustep   Level -- 4 4 -- --   Minutes -- 15 15 -- --   METs -- -- 6.3 -- --     Home Exercise Plan   Plans to continue exercise at -- Home (comment)  walking Home (comment)  walking Home (comment)  walking --   Frequency -- Add 3 additional days to program exercise sessions. Add 3 additional days to program exercise sessions. Add 3 additional days to program exercise sessions. --   Initial Home Exercises Provided -- 01/15/20 01/15/20 01/15/20 --          Exercise Comments:  Exercise Comments    Row Name 01/09/20 0848 05/02/20 0809         Exercise Comments First full day of exercise!  Patient was oriented to gym and equipment including functions, settings, policies, and procedures.  Patient's individual exercise prescription and treatment plan were reviewed.  All starting workloads were established based on the results of the 6 minute walk test done at initial orientation visit.  The plan for exercise progression was also introduced and progression will be customized based on patient's performance and goals. Jamie Roach graduated today from  rehab with 36 sessions completed.  Details of the patient's  exercise prescription and what He needs to do in order to continue the prescription and progress were discussed with patient.  Patient was given a copy of prescription and goals.  Patient verbalized understanding.  Read plans to continue to exercise by exercising at home.             Exercise Goals and Review:  Exercise Goals    Row Name  01/08/20 0923             Exercise Goals   Increase Physical Activity Yes       Intervention Provide advice, education, support and counseling about physical activity/exercise needs.;Develop an individualized exercise prescription for aerobic and resistive training based on initial evaluation findings, risk stratification, comorbidities and participant's personal goals.       Expected Outcomes Short Term: Attend rehab on a regular basis to increase amount of physical activity.;Long Term: Add in home exercise to make exercise part of routine and to increase amount of physical activity.;Long Term: Exercising regularly at least 3-5 days a week.       Increase Strength and Stamina Yes       Intervention Provide advice, education, support and counseling about physical activity/exercise needs.;Develop an individualized exercise prescription for aerobic and resistive training based on initial evaluation findings, risk stratification, comorbidities and participant's personal goals.       Expected Outcomes Short Term: Increase workloads from initial exercise prescription for resistance, speed, and METs.;Short Term: Perform resistance training exercises routinely during rehab and add in resistance training at home;Long Term: Improve cardiorespiratory fitness, muscular endurance and strength as measured by increased METs and functional capacity (6MWT)       Able to understand and use rate of perceived exertion (RPE) scale Yes       Intervention Provide education and explanation on how to use RPE scale       Expected Outcomes Short Term: Able to use RPE daily in rehab  to express subjective intensity level;Long Term:  Able to use RPE to guide intensity level when exercising independently       Able to understand and use Dyspnea scale Yes       Intervention Provide education and explanation on how to use Dyspnea scale       Expected Outcomes Short Term: Able to use Dyspnea scale daily in rehab to express subjective sense of shortness of breath during exertion;Long Term: Able to use Dyspnea scale to guide intensity level when exercising independently       Knowledge and understanding of Target Heart Rate Range (THRR) Yes       Intervention Provide education and explanation of THRR including how the numbers were predicted and where they are located for reference       Expected Outcomes Short Term: Able to state/look up THRR;Short Term: Able to use daily as guideline for intensity in rehab;Long Term: Able to use THRR to govern intensity when exercising independently       Able to check pulse independently Yes       Intervention Provide education and demonstration on how to check pulse in carotid and radial arteries.;Review the importance of being able to check your own pulse for safety during independent exercise       Expected Outcomes Short Term: Able to explain why pulse checking is important during independent exercise;Long Term: Able to check pulse independently and accurately       Understanding of Exercise Prescription Yes       Intervention Provide education, explanation, and written materials on patient's individual exercise prescription       Expected Outcomes Long Term: Able to explain home exercise prescription to exercise independently;Short Term: Able to explain program exercise prescription              Exercise Goals Re-Evaluation :  Exercise Goals Re-Evaluation    Row Name 01/09/20 [phone] 01/15/20 1305 01/23/20 0745 01/23/20 1550 02/05/20  1556     Exercise Goal Re-Evaluation   Exercise Goals Review Able to understand and use rate of perceived  exertion (RPE) scale;Knowledge and understanding of Target Heart Rate Range (THRR);Understanding of Exercise Prescription Increase Physical Activity;Increase Strength and Stamina;Able to understand and use rate of perceived exertion (RPE) scale;Able to understand and use Dyspnea scale;Knowledge and understanding of Target Heart Rate Range (THRR);Able to check pulse independently;Understanding of Exercise Prescription Increase Physical Activity;Increase Strength and Stamina;Understanding of Exercise Prescription Increase Physical Activity;Increase Strength and Stamina;Able to understand and use rate of perceived exertion (RPE) scale;Able to understand and use Dyspnea scale;Knowledge and understanding of Target Heart Rate Range (THRR);Able to check pulse independently;Understanding of Exercise Prescription Increase Physical Activity;Increase Strength and Stamina;Understanding of Exercise Prescription   Comments Reviewed RPE and dyspnea scales, THR and program prescription with pt today.  Pt voiced understanding and was given a copy of goals to take home. Reviewed home exercise with pt today.  Pt plans to continue to walk at home for exercise.  Also talked about using staff videos as well for exercise.  Reviewed THR, pulse, RPE, sign and symptoms, NTG use, and when to call 911 or MD.  Also discussed weather considerations and indoor options.  Pt voiced understanding. Jamie Roach was out the end of last week for a work trip.  He did not exercise while he was gone but plans to get back to it now that they are back.  He is starting to get back his strength and stamina and regaining condfidence too. -- Jamie Roach is doing well in rehab.  He is now back to driving again. He even said that he liked the elliptical and enjoys coming to class!  He is up to 6.9 METs on XR.  We will continue to monitor his progress.   Expected Outcomes Short: Use RPE daily to regulate intensity. Long: Follow program prescription in THR. Short: Start to add in  exercise at home on off days from rehab.  Long: Continue to improve stamina and confidence Short: Continue to gain confidence in heart again  Long: Continue to improve stamina and get back to normal routine -- Short: Continue to improve on elliptical Long: Continue to get back to normal life again.   Regina Name 02/15/20 3007 02/22/20 0915 03/07/20 1414 03/13/20 0744 03/19/20 1436     Exercise Goal Re-Evaluation   Exercise Goals Review Increase Physical Activity;Increase Strength and Stamina;Understanding of Exercise Prescription Increase Physical Activity;Increase Strength and Stamina;Understanding of Exercise Prescription Increase Physical Activity;Increase Strength and Stamina;Understanding of Exercise Prescription Increase Physical Activity;Increase Strength and Stamina;Understanding of Exercise Prescription Increase Physical Activity;Increase Strength and Stamina;Understanding of Exercise Prescription   Comments Jamie Roach is doing well in rehab.  He walks at work but not much else.  He has a bike that he is going to start to use.  He has been doing a lot of yard work.  His stamina is getting better but he is still very tired in the evening and has some flutters.  We talked about talking to the doc about it. Jamie Roach has made great progress and increased workloads and overall MET level.  He has also moved up on weights strength training. Jamie Roach has been doing well in rehab.  He doing well on the ellipitcal and with intervals.  He is now on level 10 for the XR.  We will continue to monitor his progress. Jamie Roach is doing well in rehab.  His confidence is getting better in his ability to exercise again.  He  is walking on his off days at home.  He is feeling stronger and has more stamina.  They are going to the beach this weekend. Jamie Roach continues to to yield good results in rehab and is able to exercise independently outside of the program.   Expected Outcomes Short: Talk to doc abotu flutters and add in more exercise  Long:  Continue to build stamina back up. Short: continue to attend consistently Long:  continue to progress workloads Short: Continue with intervals  Long; Continue to improve stamina and confidence Short: Continue with intervals  Long; Continue to improve stamina and confidence Short: Continue to walk on his off days Long: Increase endurance and strength, maintain independent exercise at home in appropriate HR ranges   Row Name 04/01/20 1432 04/09/20 0734 04/11/20 0723 04/17/20 1317       Exercise Goal Re-Evaluation   Exercise Goals Review Increase Physical Activity;Increase Strength and Stamina;Understanding of Exercise Prescription Increase Physical Activity;Increase Strength and Stamina;Understanding of Exercise Prescription Increase Physical Activity;Increase Strength and Stamina;Understanding of Exercise Prescription Increase Physical Activity;Increase Strength and Stamina;Understanding of Exercise Prescription    Comments Jamie Roach continues to do well in rehab. He is out on vacation visiting family this week.  He was very excited to be going and having the confidence to go again.  He is up to level 10 on the XR with intervals.  He is also at 7% grade on the treadmill.  We will continue to montior his progress. Jamie Roach is doing well. He continutes to walk 2-3 days/ 15-20 minutes, outside. Acknowledges when he should not exercise outside due to weather. Plans to go to [facility] after his completion at rehab -- Jamie Roach improved his post 6MWT by 27.5 % !  He has made great progess in the program    Expected Outcomes Short: Contineu to use intervals and exercise on vacation Long: Continue to exercise independently with more confidence Short: Increase exercise duration to about 30 minutes/session. Long: Continue to improve overall workloads and MET level -- Short: complete HT program Long: maintain exercise indepedently           Discharge Exercise Prescription (Final Exercise Prescription Changes):  Exercise Prescription  Changes - 04/17/20 1300      Response to Exercise   Blood Pressure (Admit) 122/62    Blood Pressure (Exercise) 134/70    Blood Pressure (Exit) 120/64    Heart Rate (Admit) 79 bpm    Heart Rate (Exercise) 165 bpm    Heart Rate (Exit) 100 bpm    Rating of Perceived Exertion (Exercise) 13    Symptoms none    Duration Continue with 30 min of aerobic exercise without signs/symptoms of physical distress.    Intensity THRR unchanged      Progression   Progression Continue to progress workloads to maintain intensity without signs/symptoms of physical distress.    Average METs 6.8      Resistance Training   Training Prescription Yes    Weight  8 lb    Reps 10-15      Interval Training   Interval Training Yes      Treadmill   MPH 3.7    Grade 8    Minutes 15    METs 7.91      Elliptical   Level 8    Minutes 15    METs 5.5           Nutrition:  Target Goals: Understanding of nutrition guidelines, daily intake of sodium <1574m, cholesterol <2045m calories  30% from fat and 7% or less from saturated fats, daily to have 5 or more servings of fruits and vegetables.  Education: Controlling Sodium/Reading Food Labels -Group verbal and written material supporting the discussion of sodium use in heart healthy nutrition. Review and explanation with models, verbal and written materials for utilization of the food label.   Education: General Nutrition Guidelines/Fats and Fiber: -Group instruction provided by verbal, written material, models and posters to present the general guidelines for heart healthy nutrition. Gives an explanation and review of dietary fats and fiber.   Biometrics:  Pre Biometrics - 01/08/20 0924      Pre Biometrics   Height 5' 9.75" (1.772 m)    Weight 168 lb 8 oz (76.4 kg)    BMI (Calculated) 24.34    Single Leg Stand 30 seconds           Post Biometrics - 04/16/20 0826       Post  Biometrics   Height 5' 9.75" (1.772 m)    Weight 166 lb 12.8 oz  (75.7 kg)    BMI (Calculated) 24.1           Nutrition Therapy Plan and Nutrition Goals:  Nutrition Therapy & Goals - 01/12/20 0951      Nutrition Therapy   Diet Low Na, Heart Healthy    Protein (specify units) 65-70g    Fiber 30 grams    Whole Grain Foods 3 servings    Saturated Fats 12 max. grams    Fruits and Vegetables 5 servings/day    Sodium 1.5 grams      Personal Nutrition Goals   Nutrition Goal ST: increase variety LT: feel sercure to exercise outside of rehab    Comments Pt having smoothies BID with a meal of chicken and vegetables for dinner. Smoothies include supplements such as vitamin D, greens supplement, other greens, walnuts, berries, and collegen protein powder as well as pea protein. Discussed heart healthy eating, sufficient protein, variety, microbiome, satiety, honoring hunger, social aspects of food, and answered pt and pt wife questions.      Intervention Plan   Intervention Prescribe, educate and counsel regarding individualized specific dietary modifications aiming towards targeted core components such as weight, hypertension, lipid management, diabetes, heart failure and other comorbidities.;Nutrition handout(s) given to patient.    Expected Outcomes Short Term Goal: Understand basic principles of dietary content, such as calories, fat, sodium, cholesterol and nutrients.;Short Term Goal: A plan has been developed with personal nutrition goals set during dietitian appointment.;Long Term Goal: Adherence to prescribed nutrition plan.           Nutrition Assessments:  Nutrition Assessments - 04/25/20 0958      MEDFICTS Scores   Pre Score 30    Post Score 9    Score Difference -21           MEDIFICTS Score Key:          ?70 Need to make dietary changes          40-70 Heart Healthy Diet         ? 40 Therapeutic Level Cholesterol Diet  Nutrition Goals Re-Evaluation:  Nutrition Goals Re-Evaluation    [place] Name 03/13/20 0816 04/09/20 0748            Goals   Nutrition Goal ST: increase variety LT: feel sercure to exercise outside of rehab ST: increase variety LT: feel sercure to exercise outside of rehab      Comment Jamie Roach continues to make  improvements to his diet.  He is getting more comfortable with trying new things and adding to his diet. --      Expected Outcome Short: Continue to increase variety  Long: Continue to eat heart healthy. --             Nutrition Goals Discharge (Final Nutrition Goals Re-Evaluation):  Nutrition Goals Re-Evaluation - 04/09/20 0748      Goals   Nutrition Goal ST: increase variety LT: feel sercure to exercise outside of rehab           Psychosocial: Target Goals: Acknowledge presence or absence of significant depression and/or stress, maximize coping skills, provide positive support system. Participant is able to verbalize types and ability to use techniques and skills needed for reducing stress and depression.   Education: Depression - Provides group verbal and written instruction on the correlation between heart/lung disease and depressed mood, treatment options, and the stigmas associated with seeking treatment.   Cardiac Rehab from 05/02/2020 in [facility] Cardiac and Pulmonary Rehab  Date 04/25/20  Educator [facility]  Instruction Review Code 1- Verbalizes Understanding      Education: Sleep Hygiene -Provides group verbal and written instruction about how sleep can affect your health.  Define sleep hygiene, discuss sleep cycles and impact of sleep habits. Review good sleep hygiene tips.     Education: Stress and Anxiety: - Provides group verbal and written instruction about the health risks of elevated stress and causes of high stress.  Discuss the correlation between heart/lung disease and anxiety and treatment options. Review healthy ways to manage with stress and anxiety.   Cardiac Rehab from 05/02/2020 in [facility] Cardiac and Pulmonary Rehab  Date 04/25/20  Educator [facility]  Instruction Review Code 1-  Verbalizes Understanding       Initial Review & Psychosocial Screening:  Initial Psych Review & Screening - 01/04/20 1345      Initial Review   Current issues with Current Stress Concerns    Comments post MI and stent      Family Dynamics   Good Support System? Yes      Barriers   Psychosocial barriers to participate in program There are no identifiable barriers or psychosocial needs.;The patient should benefit from training in stress management and relaxation.      Screening Interventions   Interventions To provide support and resources with identified psychosocial needs    Expected Outcomes Short Term goal: Utilizing psychosocial counselor, staff and physician to assist with identification of specific Stressors or current issues interfering with healing process. Setting desired goal for each stressor or current issue identified.;Long Term Goal: Stressors or current issues are controlled or eliminated.;Short Term goal: Identification and review with participant of any Quality of Life or Depression concerns found by scoring the questionnaire.;Long Term goal: The participant improves quality of Life and PHQ9 Scores as seen by post scores and/or verbalization of changes           Quality of Life Scores:   Quality of Life - 04/25/20 0958      Quality of Life Scores   Health/Function Pre 26.57 %    Health/Function Post 28.63 %    Health/Function % Change 7.75 %    Socioeconomic Pre 30 %    Socioeconomic Post 30 %    Socioeconomic % Change  0 %    Psych/Spiritual Pre 28.29 %    Psych/Spiritual Post 29.14 %    Psych/Spiritual % Change 3 %    Family Pre 25.5 %  Family Post 28.5 %    Family % Change 11.76 %    GLOBAL Pre 27.56 %    GLOBAL Post 29.02 %    GLOBAL % Change 5.3 %          Scores of 19 and below usually indicate a poorer quality of life in these areas.  A difference of  2-3 points is a clinically meaningful difference.  A difference of 2-3 points in the total  score of the Quality of Life Index has been associated with significant improvement in overall quality of life, self-image, physical symptoms, and general health in studies assessing change in quality of life.  PHQ-9: Recent Review Flowsheet Data    Depression screen [facility] 2/9 04/25/2020 01/08/2020   Decreased Interest 0 0   Down, Depressed, Hopeless 0 0   PHQ - 2 Score 0 0   Altered sleeping 0 0   Tired, decreased energy 0 1   Change in appetite 0 0   Feeling bad or failure about yourself  0 1   Trouble concentrating 0 0   Moving slowly or fidgety/restless 0 0   Suicidal thoughts 0 0   PHQ-9 Score 0 2   Difficult doing work/chores Not difficult at all Not difficult at all     Interpretation of Total Score  Total Score Depression Severity:  1-4 = Minimal depression, 5-9 = Mild depression, 10-14 = Moderate depression, 15-19 = Moderately severe depression, 20-27 = Severe depression   Psychosocial Evaluation and Intervention:  Psychosocial Evaluation - 01/04/20 1346      Psychosocial Evaluation & Interventions   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 He is nervous about starting exercise, but also says he knows he needs to do something.    Expected Outcomes Short: attend [facility] for exercise and educaiton. Long: develop positive self care habits    Continue Psychosocial Services  Follow up required by staff           Psychosocial Re-Evaluation:  Psychosocial Re-Evaluation    [place] Name 01/23/20 [id number] 02/15/20 0814 03/13/20 0745 04/09/20 0744       Psychosocial Re-Evaluation   Current issues with Current Stress Concerns Current Stress Concerns Current Stress Concerns Current Stress Concerns    Comments Jamie Roach went on a work trip last week to [place] and freaked out a little getting out of car and through first leg.  After that, all was well and his  confidence is starting to come back and improve. He has been sleeping well.  He is feeling better and calmer now that he is back home again. Jamie Roach is getting better mentally.  His fear is starting to subside.  He has started to see a counselor and thinks it is beginning to help.  He is sleeping better, when the cat lets him. Jamie Roach continues to improve.  He feels that he is getting back to his normal self again.  Confidence is back up again.  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 Jamie Roach says he has been sleeping really well, denies any complaints with sleeping. He sees his counselor twice per month. Increasing confidence over time, will continue to see his counselor on a regular basis and says it is helping him tremendously. Denies any other big stressors.    Expected Outcomes Short: Get back into exercise routine for confidence build  up  Long: Continue to improve mental outlook. Short: Exercise regularly for mental boost and continue to see counselor  Long; Continue to allow fear to dissapate. Short: Continue to exercise for mental boost.  Long: Continue to build confidence. Short: Continue to use exercise for positive attitude, stress management Long: Maintain positive attitude, maintain confidence    Interventions Stress management education;Encouraged to attend Cardiac Rehabilitation for the exercise Stress management education;Encouraged to attend Cardiac Rehabilitation for the exercise Stress management education;Encouraged to attend Cardiac Rehabilitation for the exercise Stress management education;Encouraged to attend Cardiac Rehabilitation for the exercise    Continue Psychosocial Services  Follow up required by staff Follow up required by staff Follow up required by staff Follow up required by staff           Psychosocial Discharge (Final Psychosocial Re-Evaluation):  Psychosocial Re-Evaluation - 04/09/20 0744      Psychosocial Re-Evaluation   Current issues with Current Stress  Concerns    Comments Jamie Roach says he has been sleeping really well, denies any complaints with sleeping. He sees his counselor twice per month. Increasing confidence over time, will continue to see his counselor on a regular basis and says it is helping him tremendously. Denies any other big stressors.    Expected Outcomes Short: Continue to use exercise for positive attitude, stress management Long: Maintain positive attitude, maintain confidence    Interventions Stress management education;Encouraged to attend Cardiac Rehabilitation for the exercise    Continue Psychosocial Services  Follow up required by staff           Vocational Rehabilitation: Provide vocational rehab assistance to qualifying candidates.   Vocational Rehab Evaluation & Intervention:  Vocational Rehab - 01/04/20 1336      Initial Vocational Rehab Evaluation & Intervention   Assessment shows need for Vocational Rehabilitation No           Education: Education Goals: Education classes will be provided on a variety of topics geared toward better understanding of heart health and risk factor modification. Participant will state understanding/return demonstration of topics presented as noted by education test scores.  Learning Barriers/Preferences:  Learning Barriers/Preferences - 01/04/20 1336      Learning Barriers/Preferences   Learning Barriers None    Learning Preferences None           General Cardiac Education Topics:  AED/CPR: - Group verbal and written instruction with the use of models to demonstrate the basic use of the AED with the basic ABC's of resuscitation.   Anatomy & Physiology of the Heart: - Group verbal and written instruction and models provide basic cardiac anatomy and physiology, with the coronary electrical and arterial systems. Review of Valvular disease and Heart Failure   Cardiac Procedures: - Group verbal and written instruction to review commonly prescribed medications for  heart disease. Reviews the medication, class of the drug, and side effects. Includes the steps to properly store meds and maintain the prescription regimen. (beta blockers and nitrates)   Cardiac Medications I: - Group verbal and written instruction to review commonly prescribed medications for heart disease. Reviews the medication, class of the drug, and side effects. Includes the steps to properly store meds and maintain the prescription regimen.   Cardiac Rehab from 05/02/2020 in [facility] Cardiac and Pulmonary Rehab  Date 04/18/20  Educator sb  Instruction Review Code 1- Verbalizes Understanding      Cardiac Medications II: -Group verbal and written instruction to review commonly prescribed medications for heart disease. Reviews the medication,  class of the drug, and side effects. (all other drug classes)   Cardiac Rehab from 05/02/2020 in [facility] Cardiac and Pulmonary Rehab  Date 05/02/20  Educator SB  Instruction Review Code 1- Verbalizes Understanding       Go Sex-Intimacy & Heart Disease, Get SMART - Goal Setting: - Group verbal and written instruction through game format to discuss heart disease and the return to sexual intimacy. Provides group verbal and written material to discuss and apply goal setting through the application of the S.M.A.R.T. Method.   Other Matters of the Heart: - Provides group verbal, written materials and models to describe Stable Angina and Peripheral Artery. Includes description of the disease process and treatment options available to the cardiac patient.   Infection Prevention: - Provides verbal and written material to individual with discussion of infection control including proper hand washing and proper equipment cleaning during exercise session.   Cardiac Rehab from 05/02/2020 in [facility] Cardiac and Pulmonary Rehab  Date 01/08/20  Educator [facility]  Instruction Review Code 1- Verbalizes Understanding      Falls Prevention: - Provides verbal and written  material to individual with discussion of falls prevention and safety.   Cardiac Rehab from 05/02/2020 in [facility] Cardiac and Pulmonary Rehab  Date 01/08/20  Educator [facility]  Instruction Review Code 1- Verbalizes Understanding      Other: -Provides group and verbal instruction on various topics (see comments)   Knowledge Questionnaire Score:  Knowledge Questionnaire Score - 04/25/20 0958      Knowledge Questionnaire Score   Pre Score 25/26 Angina question only    Post Score 26/26           Core Components/Risk Factors/Patient Goals at Admission:  Personal Goals and Risk Factors at Admission - 01/08/20 0924      Core Components/Risk Factors/Patient Goals on Admission    Weight Management Yes;Weight Maintenance    Intervention Weight Management: Develop a combined nutrition and exercise program designed to reach desired caloric intake, while maintaining appropriate intake of nutrient and fiber, sodium and fats, and appropriate energy expenditure required for the weight goal.;Weight Management: Provide education and appropriate resources to help participant work on and attain dietary goals.    Admit Weight 168 lb 8 oz (76.4 kg)    Goal Weight: Short Term 168 lb (76.2 kg)    Goal Weight: Long Term 168 lb (76.2 kg)    Expected Outcomes Short Term: Continue to assess and modify interventions until short term weight is achieved;Long Term: Adherence to nutrition and physical activity/exercise program aimed toward attainment of established weight goal;Weight Maintenance: Understanding of the daily nutrition guidelines, which includes 25-35% calories from fat, 7% or less cal from saturated fats, less than 252m cholesterol, less than 1.5gm of sodium, & 5 or more servings of fruits and vegetables daily    Diabetes Yes    Intervention Provide education about signs/symptoms and action to take for hypo/hyperglycemia.;Provide education about proper nutrition, including hydration, and aerobic/resistive  exercise prescription along with prescribed medications to achieve blood glucose in normal ranges: Fasting glucose 65-99 mg/dL    Expected Outcomes Short Term: Participant verbalizes understanding of the signs/symptoms and immediate care of hyper/hypoglycemia, proper foot care and importance of medication, aerobic/resistive exercise and nutrition plan for blood glucose control.;Long Term: Attainment of HbA1C < 7%.    Lipids Yes    Intervention Provide education and support for participant on nutrition & aerobic/resistive exercise along with prescribed medications to achieve LDL <755m HDL >4072m  Expected Outcomes Short Term: Participant states understanding of desired cholesterol values and is compliant with medications prescribed. Participant is following exercise prescription and nutrition guidelines.;Long Term: Cholesterol controlled with medications as prescribed, with individualized exercise RX and with personalized nutrition plan. Value goals: LDL < 45m, HDL > 40 mg.           Education:Diabetes - Individual verbal and written instruction to review signs/symptoms of diabetes, desired ranges of glucose level fasting, after meals and with exercise. Acknowledge that pre and post exercise glucose checks will be done for 3 sessions at entry of program.   [facility]  [facility]  [facility] Instruction Review Code 1- V[employer] CorporationUnderstanding      Education: Know Your Numbers and Risk Factors: -Group verbal and written instruction about important numbers in your health.  Discussion of what are risk factors and how they play a role in the disease process.  Review of Cholesterol, Blood Pressure, Diabetes, and BMI and the role they play in your overall health.   [facility]  Date 05/02/20  Educator SB  Instruction Review Code 1- Verbalizes Understanding      Core Components/Risk  Factors/Patient Goals Review:   Goals and Risk Factor Review    Row Name 01/23/20 0751 02/15/20 0810 03/13/20 0747 04/09/20 0738       Core Components/Risk Factors/Patient Goals Review   Personal Goals Review Weight Management/Obesity;Hypertension;Diabetes Weight Management/Obesity;Hypertension;Diabetes Weight Management/Obesity;Hypertension;Diabetes Weight Management/Obesity;Hypertension;Diabetes    Review JWille Glaseris doing well in rehab. He has adjusted his eating habits some and his wait is down some.  Blood sugars have been steady for most part.  Blood pressures have been good in class and he will get back to it. JWille Glaseris doing well. His weight is holding steady overall. He is trying to keep a close eye on it to make sure he is not losing.  Blood sugars are staying lower overall.  Blood pressures are doing well in class, he has not started checking at home but will get to keep a log. Overall he is getting in to a good routine. JWille Glaseris doing well in rehab.  His sugars ran up a little high, but he changed out his reader and it leveled back out.  His pressures continue to do well in class.  He is still not checking at home.  Weight has been steady still. Jamie Roach just came back from vacation and reported he had a couple high sugars on vacation, however, most have normalized out at home. He is checking his sugars at home minimum 3x/day. BP here at rehab have been great, does not check BP at home. Weight has been steady.    Expected Outcomes Short: Get into habit of checking pressure at home  Long: Continue to monitor risk factors. Short: Check pressure regularly Long; Continue to monitor risk factors. Short: Continue to keep close eye on sugars.  Long: Continue to montior risk factors. Short: Continue to check blood sugars at home, take BP at home with monitor  Long: Continue to manage lifestyle factors           Core Components/Risk Factors/Patient Goals at Discharge (Final Review):   Goals and Risk Factor Review  - 04/09/20 0738      Core Components/Risk Factors/Patient Goals Review   Personal Goals Review Weight Management/Obesity;Hypertension;Diabetes    Review Jamie Roach just came back from vacation and reported he had  a couple high sugars on vacation, however, most have normalized out at home. He is checking his sugars at home minimum 3x/day. BP here at rehab have been great, does not check BP at home. Weight has been steady.    Expected Outcomes Short: Continue to check blood sugars at home, take BP at home with monitor  Long: Continue to manage lifestyle factors           ITP Comments:  ITP Comments    Row Name 01/04/20 1344 01/08/20 0918 01/09/20 0847 01/10/20 0534 01/12/20 0958   ITP Comments Initial telephone orientation completed. Diagnosis can be found in [facility] 3/12. EP Orientation scheduled for 4/19 Completed 6MWT and gym orientation.  Initial ITP created and sent for review to Dr. Emily Filbert, Medical Director. First full day of exercise!  Patient was oriented to gym and equipment including functions, settings, policies, and procedures.  Patient's individual exercise prescription and treatment plan were reviewed.  All starting workloads were established based on the results of the 6 minute walk test done at initial orientation visit.  The plan for exercise progression was also introduced and progression will be customized based on patient's performance and goals. 30 Day review completed. Medical Director review done, changes made as directed,and approval shown by signature of Market researcher. New to program Completed Initial RD Eval   Row Name 02/07/20 0543 03/06/20 0549 04/03/20 0619 05/01/20 0556 05/02/20 0808   ITP Comments 30 Day review completed. ITP review done, changes made as directed,and approval shown by signature of  Scientist, research (life sciences). 30 Day review completed. Medical Director ITP review done, changes made as directed, and signed approval by Medical Director. 30 Day review completed.  Medical Director ITP review done, changes made as directed, and signed approval by Medical Director. 30 Day review completed. Medical Director ITP review done, changes made as directed, and signed approval by Medical Director. Jarion graduated today from  rehab with 36 sessions completed.  Details of the patient's exercise prescription and what He needs to do in order to continue the prescription and progress were discussed with patient.  Patient was given a copy of prescription and goals.  Patient verbalized understanding.  Tyshan plans to continue to exercise by exercising at home.          Comments: Discharge ITP

## 2020-05-02 NOTE — Progress Notes (Signed)
Discharge Progress Report  Patient Details  Name: Jamie Roach MRN: [id number] Date of Birth: 06-29-1966 Referring Provider:     Cardiac Rehab from 01/08/2020 in [facility]  Referring Provider Harold Hedge MD       Number of Visits: 36/36  Reason for Discharge:  Patient reached a stable level of exercise. Patient independent in their exercise.  Smoking History:  Social History   Tobacco Use  Smoking Status Never Smoker  Smokeless Tobacco Never Used    Diagnosis:  NSTEMI (non-ST elevated myocardial infarction) (HCC)  Status post coronary artery stent placement  ADL [facility]:   Initial Exercise Prescription:  Initial Exercise Prescription - 01/08/20 0900      Date of Initial Exercise RX and Referring Provider   Date 01/08/20    Referring Provider Harold Hedge MD      Treadmill   MPH 2.6    Grade 1    Minutes 15    METs 3.3      Elliptical   Level 1    Speed 4.2    Minutes 15    METs 3      REL-XR   Level 3    Speed 50    Minutes 15    METs 3      T5 Nustep   Level 3    SPM 80    Minutes 15    METs 3      Prescription Details   Frequency (times per week) 2    Duration Progress to 30 minutes of continuous aerobic without signs/symptoms of physical distress      Intensity   THRR 40-80% of Max Heartrate 107-148    Ratings of Perceived Exertion 11-13    Perceived Dyspnea 0-4      Progression   Progression Continue to progress workloads to maintain intensity without signs/symptoms of physical distress.      Resistance Training   Training Prescription Yes    Weight 4 lb    Reps 10-15           Discharge Exercise Prescription (Final Exercise Prescription Changes):  Exercise Prescription Changes - 04/17/20 1300      Response to Exercise   Blood Pressure (Admit) 122/62    Blood Pressure (Exercise) 134/70    Blood Pressure (Exit) 120/64    Heart Rate (Admit) 79 bpm    Heart Rate (Exercise) 165 bpm     Heart Rate (Exit) 100 bpm    Rating of Perceived Exertion (Exercise) 13    Symptoms none    Duration Continue with 30 min of aerobic exercise without signs/symptoms of physical distress.    Intensity THRR unchanged      Progression   Progression Continue to progress workloads to maintain intensity without signs/symptoms of physical distress.    Average METs 6.8      Resistance Training   Training Prescription Yes    Weight  8 lb    Reps 10-15      Interval Training   Interval Training Yes      Treadmill   MPH 3.7    Grade 8    Minutes 15    METs 7.91      Elliptical   Level 8    Minutes 15    METs 5.5           Functional Capacity:  6 Minute Walk    Row Name 01/08/20 [phone] 04/16/20 0825  6 Minute Walk   Phase Initial Discharge    Distance 1400 feet 1785 feet    Distance % Change -- 27.5 %    Distance Feet Change -- 385 ft    Walk Time 6 minutes 6 minutes    # of Rest Breaks 0 0    MPH 2.65 3.38    METS 4.24 5.05    RPE 7 11    VO2 Peak 14.83 17.66    Symptoms No No    Resting HR 67 bpm 78 bpm    Resting BP 142/74  very nervous 122/62    Resting Oxygen Saturation  99 % --    Exercise Oxygen Saturation  during 6 min walk 98 % --    Max Ex. HR 93 bpm 104 bpm    Max Ex. BP 132/74 134/70    2 Minute Post BP 128/74 --           Quality of Life:  Quality of Life - 04/25/20 0958      Quality of Life Scores   Health/Function Pre 26.57 %    Health/Function Post 28.63 %    Health/Function % Change 7.75 %    Socioeconomic Pre 30 %    Socioeconomic Post 30 %    Socioeconomic % Change  0 %    Psych/Spiritual Pre 28.29 %    Psych/Spiritual Post 29.14 %    Psych/Spiritual % Change 3 %    Family Pre 25.5 %    Family Post 28.5 %    Family % Change 11.76 %    GLOBAL Pre 27.56 %    GLOBAL Post 29.02 %    GLOBAL % Change 5.3 %           Nutrition & Weight - Outcomes:  Pre Biometrics - 01/08/20 0924      Pre Biometrics   Height 5' 9.75" (1.772  m)    Weight 168 lb 8 oz (76.4 kg)    BMI (Calculated) 24.34    Single Leg Stand 30 seconds           Post Biometrics - 04/16/20 0826       Post  Biometrics   Height 5' 9.75" (1.772 m)    Weight 166 lb 12.8 oz (75.7 kg)    BMI (Calculated) 24.1          Nutrition Discharge:  Nutrition Assessments - 04/25/20 0958      MEDFICTS Scores   Pre Score 30    Post Score 9    Score Difference -21             Goals reviewed with patient; copy given to patient.

## 2021-05-19 IMAGING — CR DG CHEST 2V
1 series · 2 of 2 positions shown · non-contrast
Comparison: None.

CLINICAL DATA: Chest pain

EXAM:
CHEST - 2 VIEW

[Series 1: dg chest 2 view · 0.14mm/px · 2 of 2 slices shown]
[im 1/2]
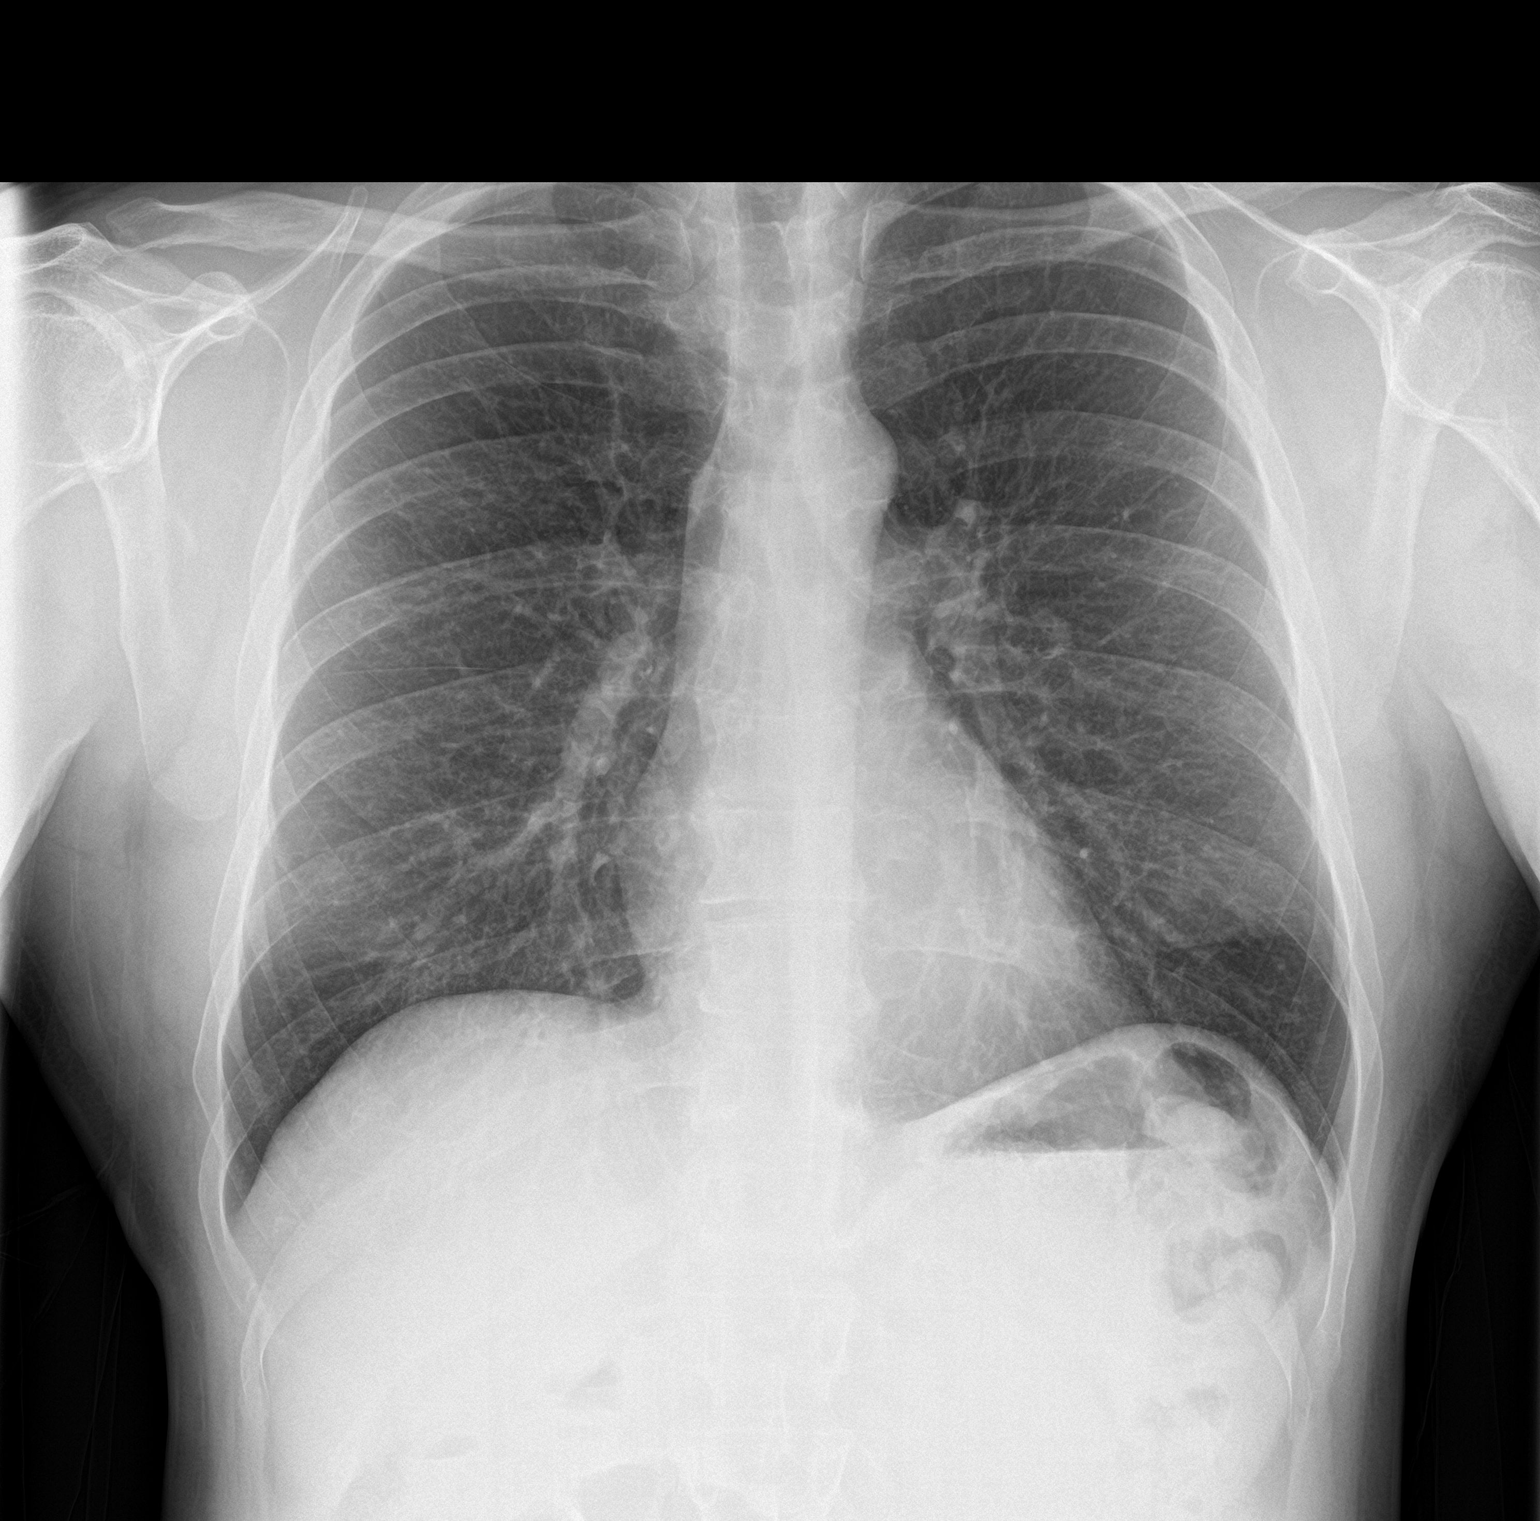
[im 2/2]
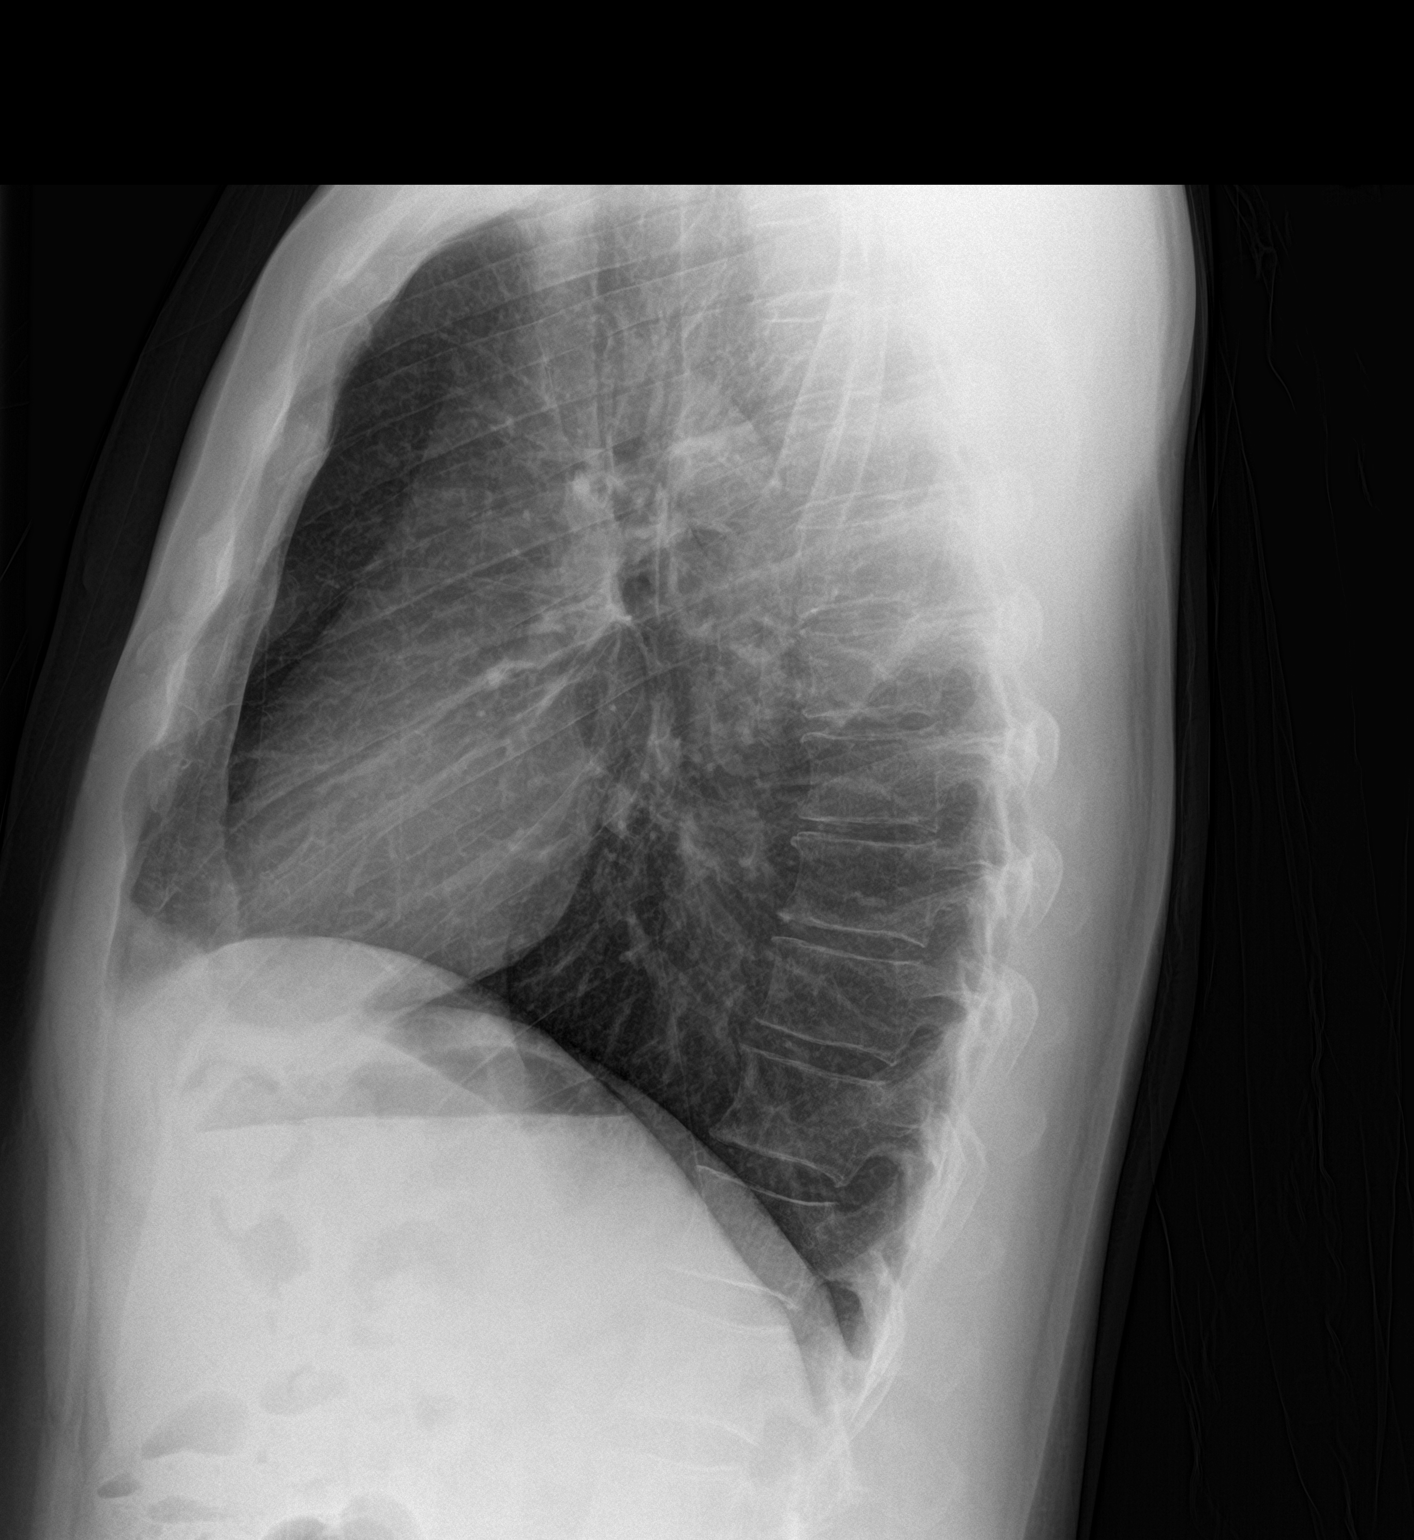

[2 of 2 positions shown; findings below may reference images not displayed]

FINDINGS: The heart size and mediastinal contours are within normal limits.
Both lungs are clear. The visualized skeletal structures are
unremarkable.
IMPRESSION: No acute abnormality of the lungs.

## 2021-05-20 IMAGING — CT CT ANGIO CHEST
2 of 6 series · 19 of 46 positions shown · IV contrast (omnipaque)
Comparison: Chest radiographs, 11/30/2019

CLINICAL DATA: Central chest pain, radiating to back

EXAM:
CT ANGIOGRAPHY CHEST WITH CONTRAST
TECHNIQUE: Multidetector CT imaging of the chest was performed using the
standard protocol during bolus administration of intravenous
contrast. Multiplanar CT image reconstructions and MIPs were
obtained to evaluate the vascular anatomy.
CONTRAST:  75mL OMNIPAQUE IOHEXOL 350 MG/ML SOLN

[Series 7: thins · axial · 0.62mm/px · z∈[-615,-334]mm · 16 of 309 slices shown]
[im 14/309  lung]
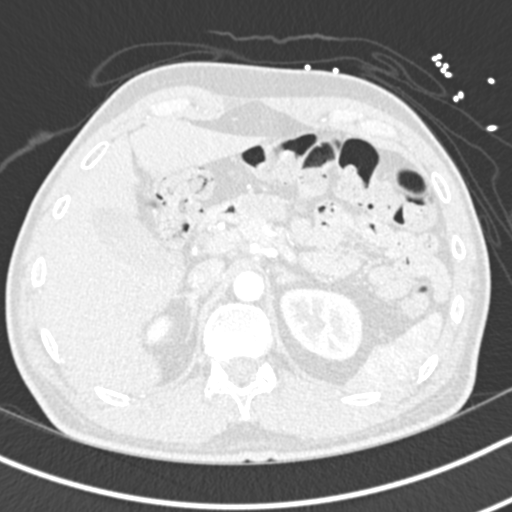
[im 41/309  soft-tissue]
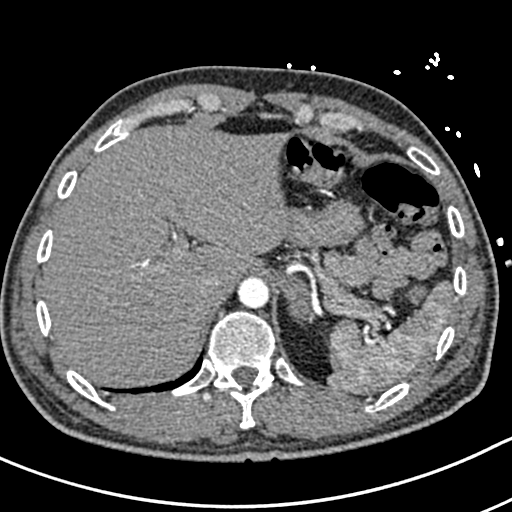
[im 54/309  lung]
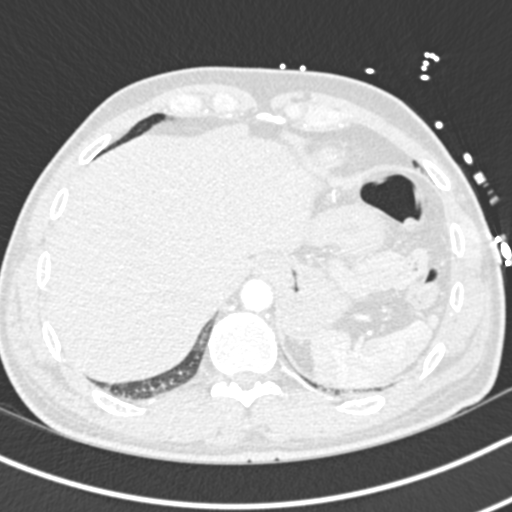
[im 67/309  soft-tissue]
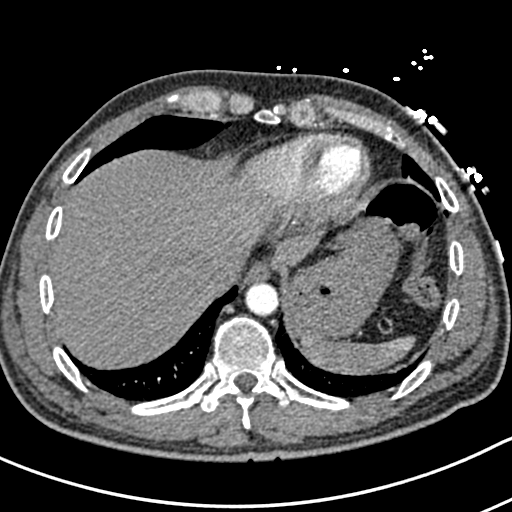
[im 94/309  lung]
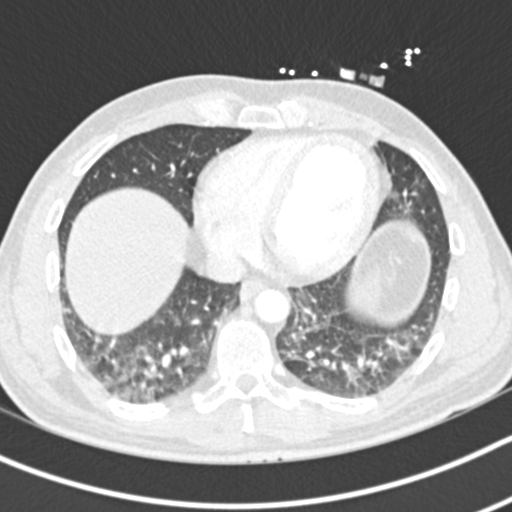
[im 108/309  soft-tissue]
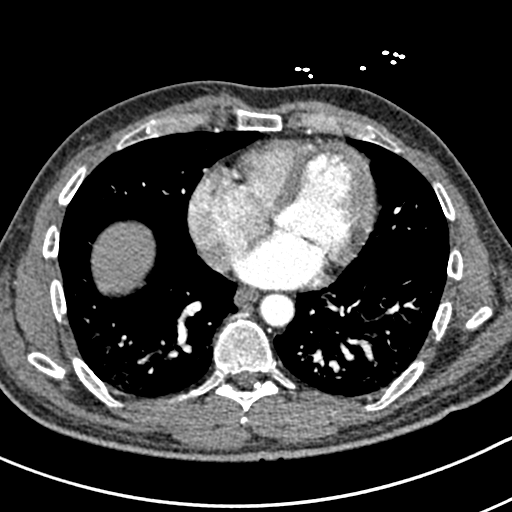
[im 121/309  lung]
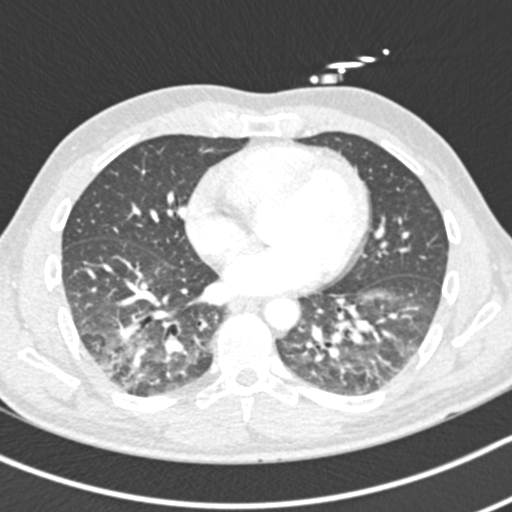
[im 148/309  soft-tissue]
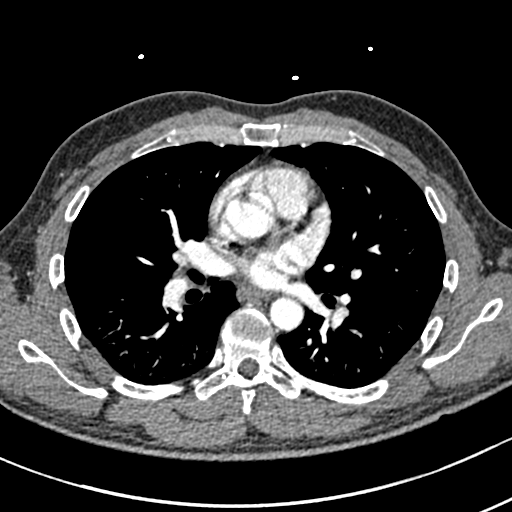
[im 161/309  lung]
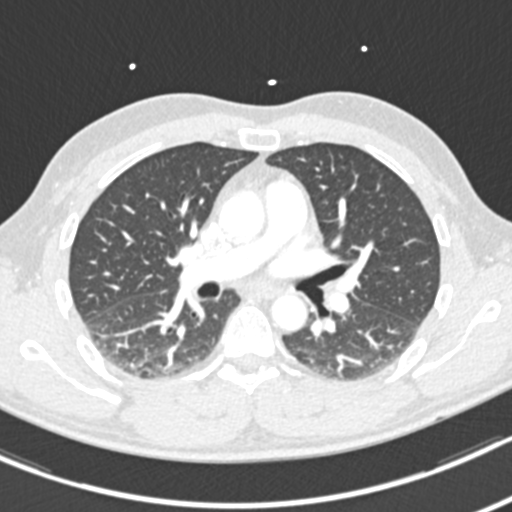
[im 188/309  soft-tissue]
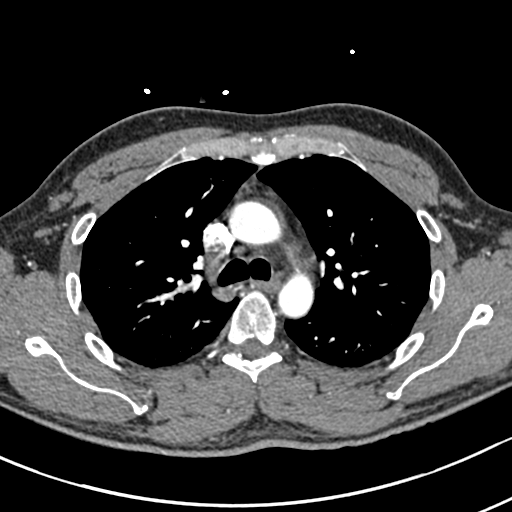
[im 201/309  lung]
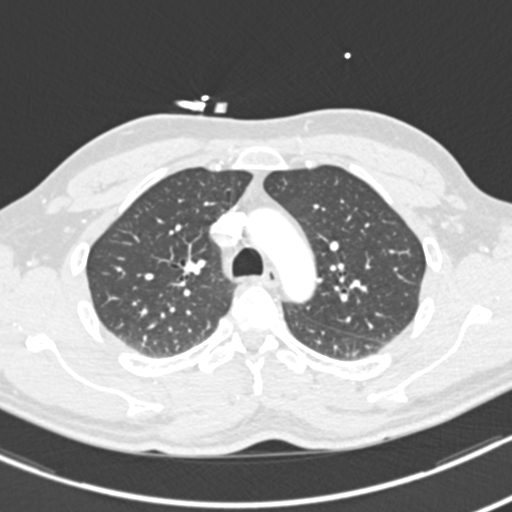
[im 215/309  soft-tissue]
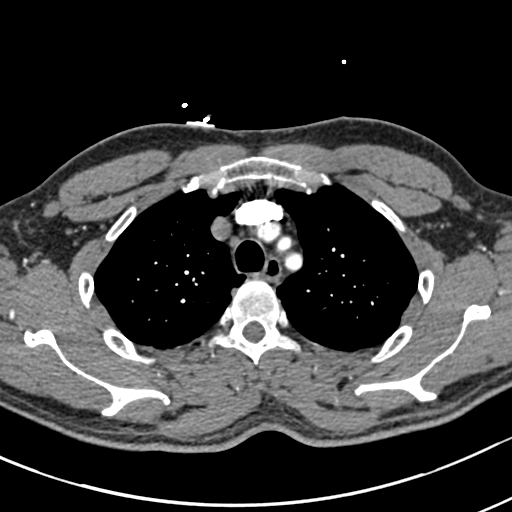
[im 242/309  lung]
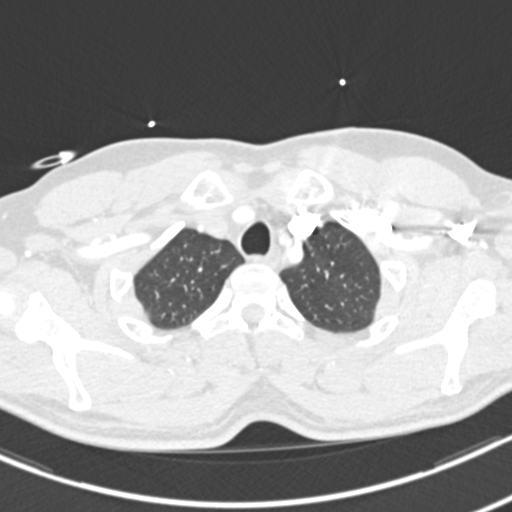
[im 255/309  soft-tissue]
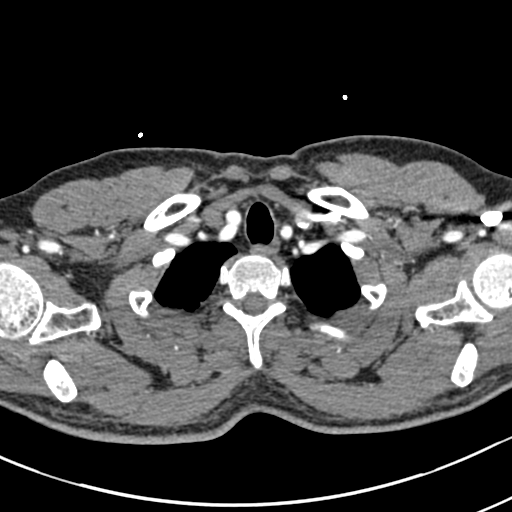
[im 268/309  lung]
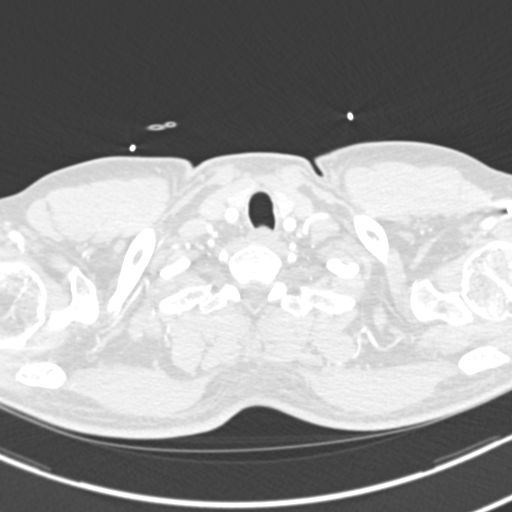
[im 295/309  soft-tissue]
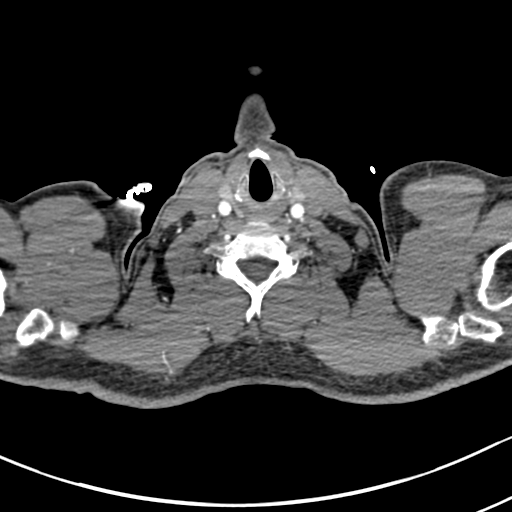

[Series 8: coronal mpr · coronal · 0.60mm/px · 3 of 84 slices shown]
[im 21/84  soft-tissue]
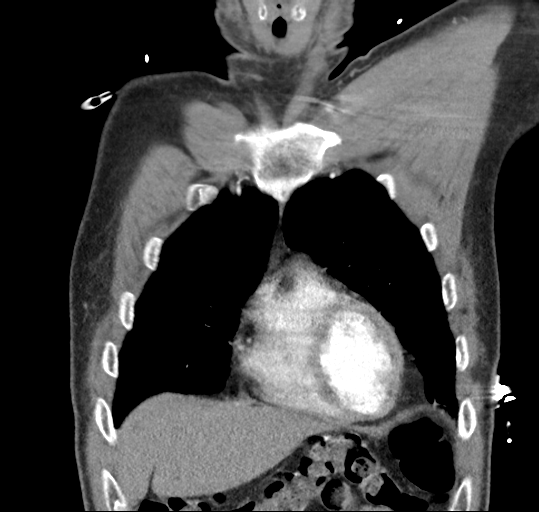
[im 42/84  soft-tissue]
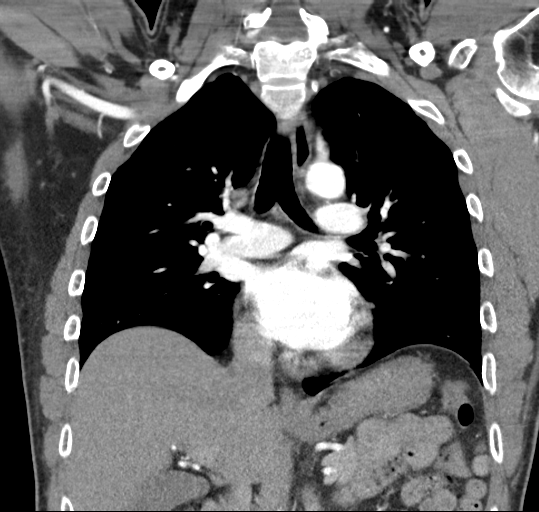
[im 63/84  soft-tissue]
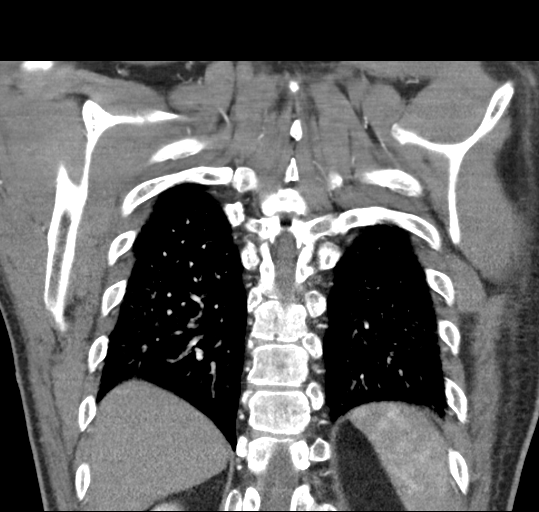

[19 of 46 positions shown; findings below may reference images not displayed]

FINDINGS: Cardiovascular: Preferential opacification of the thoracic aorta.
Normal contour and caliber of the thoracic aorta. No evidence of
aneurysm or dissection. No significant aortic atherosclerosis. No
evidence of central pulmonary embolism on this non tailored
examination normal heart size. Left coronary artery calcifications.
No pericardial effusion.

Mediastinum/Nodes: No enlarged mediastinal, hilar, or axillary lymph
nodes. Thyroid gland, trachea, and esophagus demonstrate no
significant findings.

Lungs/Pleura: Dependent bibasilar partial atelectasis and/or
scarring. No pleural effusion or pneumothorax.

Upper Abdomen: No acute abnormality.

Musculoskeletal: No chest wall abnormality. No acute or significant
osseous findings.

Review of the MIP images confirms the above findings.
IMPRESSION: 1. No CT evidence of thoracic aortic dissection, aneurysm, or other
acute aortic pathology. No significant aortic atherosclerosis.
2. Left coronary artery calcifications.

## 2022-10-14 ENCOUNTER — Other Ambulatory Visit: Payer: Self-pay | Admitting: Cardiology

## 2022-10-14 DIAGNOSIS — I214 Non-ST elevation (NSTEMI) myocardial infarction: Secondary | ICD-10-CM

## 2022-10-29 ENCOUNTER — Ambulatory Visit: Admission: RE | Admit: 2022-10-29 | Payer: BLUE CROSS/BLUE SHIELD | Source: Ambulatory Visit

## 2023-01-28 ENCOUNTER — Encounter (HOSPITAL_COMMUNITY): Payer: Self-pay

## 2023-09-21 DIAGNOSIS — Z794 Long term (current) use of insulin: Secondary | ICD-10-CM | POA: Diagnosis not present

## 2023-09-21 DIAGNOSIS — E109 Type 1 diabetes mellitus without complications: Secondary | ICD-10-CM | POA: Diagnosis not present

## 2023-09-21 DIAGNOSIS — E1065 Type 1 diabetes mellitus with hyperglycemia: Secondary | ICD-10-CM | POA: Diagnosis not present

## 2023-11-23 DIAGNOSIS — E1069 Type 1 diabetes mellitus with other specified complication: Secondary | ICD-10-CM | POA: Diagnosis not present

## 2023-11-23 DIAGNOSIS — E782 Mixed hyperlipidemia: Secondary | ICD-10-CM | POA: Diagnosis not present

## 2023-11-23 DIAGNOSIS — E103292 Type 1 diabetes mellitus with mild nonproliferative diabetic retinopathy without macular edema, left eye: Secondary | ICD-10-CM | POA: Diagnosis not present

## 2023-12-03 DIAGNOSIS — E1069 Type 1 diabetes mellitus with other specified complication: Secondary | ICD-10-CM | POA: Diagnosis not present

## 2023-12-03 DIAGNOSIS — R002 Palpitations: Secondary | ICD-10-CM | POA: Diagnosis not present

## 2023-12-03 DIAGNOSIS — E785 Hyperlipidemia, unspecified: Secondary | ICD-10-CM | POA: Diagnosis not present

## 2023-12-03 DIAGNOSIS — E103292 Type 1 diabetes mellitus with mild nonproliferative diabetic retinopathy without macular edema, left eye: Secondary | ICD-10-CM | POA: Diagnosis not present

## 2023-12-03 DIAGNOSIS — I214 Non-ST elevation (NSTEMI) myocardial infarction: Secondary | ICD-10-CM | POA: Diagnosis not present

## 2023-12-07 DIAGNOSIS — H6123 Impacted cerumen, bilateral: Secondary | ICD-10-CM | POA: Diagnosis not present

## 2023-12-07 DIAGNOSIS — H60332 Swimmer's ear, left ear: Secondary | ICD-10-CM | POA: Diagnosis not present

## 2023-12-15 DIAGNOSIS — Z794 Long term (current) use of insulin: Secondary | ICD-10-CM | POA: Diagnosis not present

## 2023-12-15 DIAGNOSIS — E109 Type 1 diabetes mellitus without complications: Secondary | ICD-10-CM | POA: Diagnosis not present

## 2023-12-15 DIAGNOSIS — E1065 Type 1 diabetes mellitus with hyperglycemia: Secondary | ICD-10-CM | POA: Diagnosis not present

## 2023-12-21 DIAGNOSIS — H6122 Impacted cerumen, left ear: Secondary | ICD-10-CM | POA: Diagnosis not present

## 2023-12-21 DIAGNOSIS — H60332 Swimmer's ear, left ear: Secondary | ICD-10-CM | POA: Diagnosis not present

## 2024-03-22 DIAGNOSIS — E1065 Type 1 diabetes mellitus with hyperglycemia: Secondary | ICD-10-CM | POA: Diagnosis not present

## 2024-03-22 DIAGNOSIS — E109 Type 1 diabetes mellitus without complications: Secondary | ICD-10-CM | POA: Diagnosis not present

## 2024-03-22 DIAGNOSIS — Z794 Long term (current) use of insulin: Secondary | ICD-10-CM | POA: Diagnosis not present

## 2024-06-20 NOTE — Progress Notes (Signed)
 Jamie Roach                                          MRN: [id number]   06/20/2024   The [facility] Quality Team Specialist reviewed this patient medical record for the purposes of chart review for care gap closure. The following were reviewed: chart review for care gap closure-kidney health evaluation for diabetes:eGFR  and uACR.    [facility] Quality Team

## 2024-06-23 DIAGNOSIS — Z79899 Other long term (current) drug therapy: Secondary | ICD-10-CM | POA: Diagnosis not present

## 2024-06-23 DIAGNOSIS — E1069 Type 1 diabetes mellitus with other specified complication: Secondary | ICD-10-CM | POA: Diagnosis not present

## 2024-06-23 DIAGNOSIS — I214 Non-ST elevation (NSTEMI) myocardial infarction: Secondary | ICD-10-CM | POA: Diagnosis not present

## 2024-06-23 DIAGNOSIS — R002 Palpitations: Secondary | ICD-10-CM | POA: Diagnosis not present

## 2024-06-23 DIAGNOSIS — E785 Hyperlipidemia, unspecified: Secondary | ICD-10-CM | POA: Diagnosis not present

## 2024-06-23 DIAGNOSIS — E103292 Type 1 diabetes mellitus with mild nonproliferative diabetic retinopathy without macular edema, left eye: Secondary | ICD-10-CM | POA: Diagnosis not present

## 2024-08-14 DIAGNOSIS — Z79899 Other long term (current) drug therapy: Secondary | ICD-10-CM | POA: Diagnosis not present

## 2024-09-13 DIAGNOSIS — H900 Conductive hearing loss, bilateral: Secondary | ICD-10-CM | POA: Diagnosis not present

## 2024-09-13 DIAGNOSIS — H6123 Impacted cerumen, bilateral: Secondary | ICD-10-CM | POA: Diagnosis not present
